# Patient Record
Sex: Male | Born: 1965 | Race: White | Hispanic: No | Marital: Single | Smoking: Current every day smoker
Health system: Southern US, Community
[De-identification: ages and names within clinical notes are randomized; demographics above are authoritative.]

## PROBLEM LIST (undated history)

## (undated) DIAGNOSIS — I10 Essential (primary) hypertension: Secondary | ICD-10-CM

## (undated) DIAGNOSIS — F329 Major depressive disorder, single episode, unspecified: Secondary | ICD-10-CM

## (undated) DIAGNOSIS — F32A Depression, unspecified: Secondary | ICD-10-CM

## (undated) HISTORY — PX: OTHER SURGICAL HISTORY: SHX169

---

## 1998-09-24 ENCOUNTER — Emergency Department (HOSPITAL_COMMUNITY): Admission: EM | Admit: 1998-09-24 | Discharge: 1998-09-24 | Payer: Self-pay | Admitting: Emergency Medicine

## 2002-10-29 ENCOUNTER — Emergency Department (HOSPITAL_COMMUNITY): Admission: EM | Admit: 2002-10-29 | Discharge: 2002-10-29 | Payer: Self-pay | Admitting: Emergency Medicine

## 2002-10-29 ENCOUNTER — Encounter: Payer: Self-pay | Admitting: Emergency Medicine

## 2003-04-02 ENCOUNTER — Emergency Department (HOSPITAL_COMMUNITY): Admission: EM | Admit: 2003-04-02 | Discharge: 2003-04-02 | Payer: Self-pay | Admitting: Emergency Medicine

## 2004-02-08 ENCOUNTER — Emergency Department (HOSPITAL_COMMUNITY): Admission: EM | Admit: 2004-02-08 | Discharge: 2004-02-08 | Payer: Self-pay | Admitting: Emergency Medicine

## 2004-05-31 ENCOUNTER — Emergency Department (HOSPITAL_COMMUNITY): Admission: EM | Admit: 2004-05-31 | Discharge: 2004-05-31 | Payer: Self-pay | Admitting: Emergency Medicine

## 2005-01-09 ENCOUNTER — Emergency Department (HOSPITAL_COMMUNITY): Admission: EM | Admit: 2005-01-09 | Discharge: 2005-01-09 | Payer: Self-pay | Admitting: Emergency Medicine

## 2005-07-28 ENCOUNTER — Emergency Department (HOSPITAL_COMMUNITY): Admission: EM | Admit: 2005-07-28 | Discharge: 2005-07-28 | Payer: Self-pay | Admitting: Emergency Medicine

## 2007-02-06 ENCOUNTER — Emergency Department (HOSPITAL_COMMUNITY): Admission: EM | Admit: 2007-02-06 | Discharge: 2007-02-06 | Payer: Self-pay | Admitting: Emergency Medicine

## 2010-12-11 ENCOUNTER — Inpatient Hospital Stay (HOSPITAL_COMMUNITY)
Admission: RE | Admit: 2010-12-11 | Discharge: 2010-12-15 | DRG: 881 | Disposition: A | Discharge status: Home / Self Care | Payer: Self-pay | Attending: Psychiatry | Admitting: Psychiatry

## 2010-12-11 DIAGNOSIS — F1020 Alcohol dependence, uncomplicated: Secondary | ICD-10-CM

## 2010-12-11 DIAGNOSIS — F329 Major depressive disorder, single episode, unspecified: Principal | ICD-10-CM

## 2010-12-11 DIAGNOSIS — E785 Hyperlipidemia, unspecified: Secondary | ICD-10-CM

## 2010-12-11 DIAGNOSIS — Z818 Family history of other mental and behavioral disorders: Secondary | ICD-10-CM

## 2010-12-11 DIAGNOSIS — F1320 Sedative, hypnotic or anxiolytic dependence, uncomplicated: Secondary | ICD-10-CM

## 2010-12-11 DIAGNOSIS — R51 Headache: Secondary | ICD-10-CM

## 2010-12-11 DIAGNOSIS — M549 Dorsalgia, unspecified: Secondary | ICD-10-CM

## 2010-12-11 DIAGNOSIS — G8929 Other chronic pain: Secondary | ICD-10-CM

## 2010-12-11 DIAGNOSIS — F3289 Other specified depressive episodes: Principal | ICD-10-CM

## 2010-12-11 DIAGNOSIS — I10 Essential (primary) hypertension: Secondary | ICD-10-CM

## 2010-12-12 DIAGNOSIS — F1920 Other psychoactive substance dependence, uncomplicated: Secondary | ICD-10-CM

## 2010-12-12 DIAGNOSIS — F329 Major depressive disorder, single episode, unspecified: Secondary | ICD-10-CM

## 2010-12-12 LAB — CBC
HCT: 46.6 % (ref 39.0–52.0)
Hemoglobin: 15.8 g/dL (ref 13.0–17.0)
RBC: 4.87 MIL/uL (ref 4.22–5.81)
RDW: 15.9 % — ABNORMAL HIGH (ref 11.5–15.5)
WBC: 7.1 10*3/uL (ref 4.0–10.5)

## 2010-12-12 LAB — COMPREHENSIVE METABOLIC PANEL
Albumin: 3.6 g/dL (ref 3.5–5.2)
Alkaline Phosphatase: 73 U/L (ref 39–117)
BUN: 15 mg/dL (ref 6–23)
Chloride: 100 mEq/L (ref 96–112)
Glucose, Bld: 113 mg/dL — ABNORMAL HIGH (ref 70–99)
Potassium: 3.6 mEq/L (ref 3.5–5.1)
Total Bilirubin: 0.5 mg/dL (ref 0.3–1.2)
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
## 2010-12-18 NOTE — Discharge Summary (Signed)
NAME:  Ricardo Collier, Ricardo Collier NO.:  [id number]  MEDICAL RECORD NO.:  [id number]  LOCATION:  0300                          FACILITY:  [facility]  PHYSICIAN:  Ricardo Aloe, MD       DATE OF BIRTH:  06-Jul-1965  DATE OF ADMISSION:  12/11/2010 DATE OF DISCHARGE:  12/15/2010                              DISCHARGE SUMMARY   IDENTIFYING INFORMATION:  This is a 45 year old single male.  This is a voluntary admission.  HISTORY OF PRESENT ILLNESS:  [facility] admission for Ricardo Collier who presented by way of our emergency room where he requested help getting rid of voices that were telling him to drink alcohol and steal things.  He reported that he had just been released from prison after a 4-day stay for stealing a television from the assisted living facility where his mother lives.  He stole a television and got 400 dollars for it, for which he used for alcohol and drugs.  He complains of hearing voices but on closer examination, these are his own thoughts and in fact has no auditory hallucinations.  He reported prior to being jailed he had been taking Xanax off the street for about 3-4 months and was drinking about 18 beers a day.  He reports he cannot settle down, feels restless and anxious.  Denies active suicidal thoughts and wanting help.  MEDICAL EVALUATION AND DIAGNOSTIC STUDIES:  He was medically evaluated in the emergency room, at one point had been told he had high blood pressure and was prescribed HCTZ but is not taking it.  Has no regular primary care provider. Chronic medical problems include occasional headaches and some chronic back pain.  PAST MEDICAL HISTORY:  Significant for no previous back surgery.  No history of seizures.  He did receive some type of medications while in jail and does not know what they were.  PHYSICAL EXAMINATION:  Reveals a normally developed, mildly overweight Caucasian male in no physical distress. VITAL SIGNS:  Pulse 71, blood pressure 134/90.   He is afebrile, weighs 99 kg, 6 feet 1-1/2 inches tall and no abnormal movements. MOTOR:  Is smooth.  No acute signs of withdrawal.  Liver enzymes are normal and normal chemistries, electrolytes and CBC.  COURSE OF HOSPITALIZATION:  He was admitted to our dual diagnosis program and was initially evaluated by Dr. Lawana Collier.  He was transferred to the care of Dr. Orson Collier on July 30, 20212. Participation in unit activities and group therapy was satisfactory. After discussion of risks and benefits he agreed to a trial of Celexa 20 mg daily for his depressed mood and Vistaril 50 mg b.i.d. for anxiety. He was given a provisional diagnosis of depressive disorder NOS and polysubstance abuse versus dependence.  He did not require detox while on our unit.  Did complain of just not feeling right unless he was taking Xanax, had been taking it for quite awhile.  Continued to complain of some anxiety and on December 13, 2010 we added trazodone 100 mg p.o. at bedtime to assist him with sleep and gabapentin 300 mg t.i.d. for anxiety.  We continued to work with the medications and his gabapentin was eventually  increased to 300 mg t.i.d. and 600 mg at bedtime.  He participated well in a.m. group on November 26, 2010 and admitted to alcohol and benzodiazepine abuse and dependence. He worked with our Sports coach and secured an appointment to follow up with [facility] for an assessment for residential treatment.  By July 31,2012 he was doing well.  No dangerous thoughts, tolerating the gabapentin well, had plans to go live with friends and until he goes to [facility] next week.  DISCHARGE/PLAN:  Follow up with [facility] on December 17, 2010 at 8:00 a.m. for follow up with medications and possible counseling and report to [facility] on December 21, 2010 at 8:00 a.m. for evaluation.  DISCHARGE DIAGNOSES:  Axis I:  Depressive disorder NOS, alcohol dependence, benzodiazepine  dependence. Axis II:  Deferred. Axis III:  Hypertension, hyperlipidemia. Axis IV:  Moderate legal issues and psychosocial issues. Axis V:  Current 43.  CONDITION ON DISCHARGE:  Stable.  DISCHARGE MEDICATIONS: 1. Citalopram 40 mg daily for depression and anxiety. 2. Gabapentin 300 mg t.i.d. and 600 mg at bedtime for anxiety. 3. Hydroxyzine 50 mg cap take 2 capsules at bedtime for insomnia and     up to 50 mg b.i.d. p.r.n. for anxiety. 4. Ibuprofen 800 mg t.i.d. p.r.n. for back pain. 5. He is instructed to discontinue lisinopril, Flexeril and     alprazolam.     Ricardo Collier. Ricardo Collier, N.P.   ______________________________ Ricardo Aloe, MD    MAS/MEDQ  D:  12/15/2010  T:  12/15/2010  Job:  [id number]  Electronically Signed by Ricardo Collier N.P. on 12/15/2010 06:57:16 PM Electronically Signed by Ricardo Collier  on 12/18/2010 10:40:12 AM

## 2012-10-20 ENCOUNTER — Emergency Department (HOSPITAL_BASED_OUTPATIENT_CLINIC_OR_DEPARTMENT_OTHER)
Admission: EM | Admit: 2012-10-20 | Discharge: 2012-10-20 | Disposition: A | Discharge status: Home / Self Care | Payer: BC Managed Care – PPO | Attending: Emergency Medicine | Admitting: Emergency Medicine

## 2012-10-20 ENCOUNTER — Emergency Department (HOSPITAL_BASED_OUTPATIENT_CLINIC_OR_DEPARTMENT_OTHER): Payer: BC Managed Care – PPO

## 2012-10-20 ENCOUNTER — Encounter (HOSPITAL_BASED_OUTPATIENT_CLINIC_OR_DEPARTMENT_OTHER): Payer: Self-pay | Admitting: *Deleted

## 2012-10-20 DIAGNOSIS — T148XXA Other injury of unspecified body region, initial encounter: Secondary | ICD-10-CM

## 2012-10-20 DIAGNOSIS — S90129A Contusion of unspecified lesser toe(s) without damage to nail, initial encounter: Secondary | ICD-10-CM | POA: Insufficient documentation

## 2012-10-20 DIAGNOSIS — F17200 Nicotine dependence, unspecified, uncomplicated: Secondary | ICD-10-CM | POA: Insufficient documentation

## 2012-10-20 DIAGNOSIS — S99929A Unspecified injury of unspecified foot, initial encounter: Secondary | ICD-10-CM | POA: Insufficient documentation

## 2012-10-20 DIAGNOSIS — IMO0002 Reserved for concepts with insufficient information to code with codable children: Secondary | ICD-10-CM

## 2012-10-20 DIAGNOSIS — Y9289 Other specified places as the place of occurrence of the external cause: Secondary | ICD-10-CM | POA: Insufficient documentation

## 2012-10-20 DIAGNOSIS — W208XXA Other cause of strike by thrown, projected or falling object, initial encounter: Secondary | ICD-10-CM | POA: Insufficient documentation

## 2012-10-20 DIAGNOSIS — S8990XA Unspecified injury of unspecified lower leg, initial encounter: Secondary | ICD-10-CM | POA: Insufficient documentation

## 2012-10-20 DIAGNOSIS — S91109A Unspecified open wound of unspecified toe(s) without damage to nail, initial encounter: Secondary | ICD-10-CM | POA: Insufficient documentation

## 2012-10-20 DIAGNOSIS — Y9389 Activity, other specified: Secondary | ICD-10-CM | POA: Insufficient documentation

## 2012-10-20 MED ORDER — SULFAMETHOXAZOLE-TRIMETHOPRIM 800-160 MG PO TABS: 1.0000 | ORAL_TABLET | Freq: Two times a day (BID) | ORAL | Status: DC

## 2012-10-20 MED ORDER — HYDROCODONE-ACETAMINOPHEN 5-325 MG PO TABS: 2.0000 | ORAL_TABLET | ORAL | Status: DC | PRN

## 2012-10-20 NOTE — ED Provider Notes (Signed)
History     CSN: [id number]  Arrival date & time 10/20/12  2000   First MD Initiated Contact with Patient 10/20/12 2030      Chief Complaint  Patient presents with  . Foot Injury    (Consider location/radiation/quality/duration/timing/severity/associated sxs/prior treatment) HPI Comments: Comes to the ER for evaluation of foot injury. Patient reports that a very heavy piece of wood fell out of a truck and landed on his left foot yesterday. Patient has been having severe pain ever since. Pain worsens if he touches it or tries to walk.  Patient is a 47 y.o. male presenting with foot injury.  Foot Injury   History reviewed. No pertinent past medical history.  History reviewed. No pertinent past surgical history.  No family history on file.  History  Substance Use Topics  . Smoking status: Current Every Day Smoker -- 0.50 packs/day    Types: Cigarettes  . Smokeless tobacco: Not on file  . Alcohol Use: Yes      Review of Systems  Skin: Positive for wound.    Allergies  Review of patient's allergies indicates no known allergies.  Home Medications  No current outpatient prescriptions on file.  BP 127/91  Pulse 106  Temp(Src) 98.1 F (36.7 C) (Oral)  Resp 18  Ht 6\' 2"  (1.88 m)  Wt 220 lb (99.791 kg)  BMI 28.23 kg/m2  SpO2 96%  Physical Exam  Musculoskeletal:       Left foot: He exhibits tenderness, deformity and laceration.       Feet:  Skin:       ED Course  Procedures (including critical care time)  Labs Reviewed - No data to display Dg Foot Complete Left  10/20/2012   *RADIOLOGY REPORT*  Clinical Data: Foot pain/injury, bruising  LEFT FOOT - COMPLETE 3+ VIEW  Comparison: None.  Findings: No fracture or dislocation is seen.  The joint spaces are preserved.  The visualized soft tissues are unremarkable.  IMPRESSION: No fracture or dislocation is seen.   Original Report Authenticated By: Charline Bills, M.D.     Diagnosis: Contusion and laceration  left great toe and foot    MDM  Patient presents to the ER for evaluation of a crush injury to his left foot. Patient has bruising and swelling of the great toe and the dorsal aspect of the foot distally. There is no obvious deformity. He does have a laceration on the left side of the toe. Toenail is partially avulsed. X-ray shows no evidence of fracture. Wound is greater than 24 hours old, could not be sutured. Local wound care provided. Patient to rest, elevate and ice the area. Analgesia provided.        Gilda Crease, MD 10/20/12 2102

## 2012-10-20 NOTE — ED Notes (Signed)
Left foot injury yesterday when he dropped a piece of a log fall from the truck tailgate landing on his foot. Crush injury. Blood under his great toenail.

## 2014-05-23 ENCOUNTER — Emergency Department (HOSPITAL_BASED_OUTPATIENT_CLINIC_OR_DEPARTMENT_OTHER)
Admission: EM | Admit: 2014-05-23 | Discharge: 2014-05-23 | Disposition: A | Discharge status: Home / Self Care | Payer: Self-pay | Attending: Emergency Medicine | Admitting: Emergency Medicine

## 2014-05-23 ENCOUNTER — Encounter (HOSPITAL_BASED_OUTPATIENT_CLINIC_OR_DEPARTMENT_OTHER): Payer: Self-pay | Admitting: *Deleted

## 2014-05-23 ENCOUNTER — Emergency Department (HOSPITAL_BASED_OUTPATIENT_CLINIC_OR_DEPARTMENT_OTHER): Payer: Self-pay

## 2014-05-23 DIAGNOSIS — S51811A Laceration without foreign body of right forearm, initial encounter: Secondary | ICD-10-CM | POA: Insufficient documentation

## 2014-05-23 DIAGNOSIS — S61012A Laceration without foreign body of left thumb without damage to nail, initial encounter: Secondary | ICD-10-CM | POA: Insufficient documentation

## 2014-05-23 DIAGNOSIS — Z23 Encounter for immunization: Secondary | ICD-10-CM | POA: Insufficient documentation

## 2014-05-23 DIAGNOSIS — Y998 Other external cause status: Secondary | ICD-10-CM | POA: Insufficient documentation

## 2014-05-23 DIAGNOSIS — S61011A Laceration without foreign body of right thumb without damage to nail, initial encounter: Secondary | ICD-10-CM | POA: Insufficient documentation

## 2014-05-23 DIAGNOSIS — R52 Pain, unspecified: Secondary | ICD-10-CM

## 2014-05-23 DIAGNOSIS — Z720 Tobacco use: Secondary | ICD-10-CM | POA: Insufficient documentation

## 2014-05-23 DIAGNOSIS — Y9289 Other specified places as the place of occurrence of the external cause: Secondary | ICD-10-CM | POA: Insufficient documentation

## 2014-05-23 DIAGNOSIS — I10 Essential (primary) hypertension: Secondary | ICD-10-CM | POA: Insufficient documentation

## 2014-05-23 DIAGNOSIS — W540XXA Bitten by dog, initial encounter: Secondary | ICD-10-CM | POA: Insufficient documentation

## 2014-05-23 DIAGNOSIS — S60511A Abrasion of right hand, initial encounter: Secondary | ICD-10-CM | POA: Insufficient documentation

## 2014-05-23 DIAGNOSIS — Z79899 Other long term (current) drug therapy: Secondary | ICD-10-CM | POA: Insufficient documentation

## 2014-05-23 DIAGNOSIS — Y9389 Activity, other specified: Secondary | ICD-10-CM | POA: Insufficient documentation

## 2014-05-23 HISTORY — DX: Essential (primary) hypertension: I10

## 2014-05-23 MED ORDER — METHOCARBAMOL 500 MG PO TABS: 500.0000 mg | ORAL_TABLET | Freq: Two times a day (BID) | ORAL | Status: DC

## 2014-05-23 MED ORDER — AMOXICILLIN-POT CLAVULANATE 875-125 MG PO TABS
1.0000 | ORAL_TABLET | Freq: Once | ORAL | Status: AC
  Administered 2014-05-23: 1 via ORAL
  Filled 2014-05-23: qty 1

## 2014-05-23 MED ORDER — METHOCARBAMOL 500 MG PO TABS
1000.0000 mg | ORAL_TABLET | Freq: Once | ORAL | Status: AC
  Administered 2014-05-23: 1000 mg via ORAL
  Filled 2014-05-23: qty 2

## 2014-05-23 MED ORDER — OXYCODONE-ACETAMINOPHEN 5-325 MG PO TABS
1.0000 | ORAL_TABLET | Freq: Once | ORAL | Status: AC
  Administered 2014-05-23: 1 via ORAL
  Filled 2014-05-23: qty 1

## 2014-05-23 MED ORDER — NAPROXEN 375 MG PO TABS: 375.0000 mg | ORAL_TABLET | Freq: Two times a day (BID) | ORAL | Status: DC

## 2014-05-23 MED ORDER — TETANUS-DIPHTH-ACELL PERTUSSIS 5-2.5-18.5 LF-MCG/0.5 IM SUSP
0.5000 mL | Freq: Once | INTRAMUSCULAR | Status: AC
  Administered 2014-05-23: 0.5 mL via INTRAMUSCULAR
  Filled 2014-05-23: qty 0.5

## 2014-05-23 MED ORDER — AMOXICILLIN-POT CLAVULANATE 875-125 MG PO TABS: 1.0000 | ORAL_TABLET | Freq: Two times a day (BID) | ORAL | Status: DC

## 2014-05-23 MED ORDER — TRAMADOL HCL 50 MG PO TABS: 50.0000 mg | ORAL_TABLET | Freq: Four times a day (QID) | ORAL | Status: DC | PRN

## 2014-05-23 NOTE — ED Notes (Signed)
Pt c/o multiple dog bites to hands and arms. Also c/o lower back pain
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
## 2014-07-31 ENCOUNTER — Encounter (HOSPITAL_BASED_OUTPATIENT_CLINIC_OR_DEPARTMENT_OTHER): Payer: Self-pay

## 2014-07-31 ENCOUNTER — Emergency Department (HOSPITAL_BASED_OUTPATIENT_CLINIC_OR_DEPARTMENT_OTHER): Payer: Self-pay

## 2014-07-31 ENCOUNTER — Emergency Department (HOSPITAL_BASED_OUTPATIENT_CLINIC_OR_DEPARTMENT_OTHER)
Admission: EM | Admit: 2014-07-31 | Discharge: 2014-08-01 | Disposition: A | Discharge status: Home / Self Care | Payer: Self-pay | Attending: Emergency Medicine | Admitting: Emergency Medicine

## 2014-07-31 DIAGNOSIS — I10 Essential (primary) hypertension: Secondary | ICD-10-CM | POA: Insufficient documentation

## 2014-07-31 DIAGNOSIS — R0602 Shortness of breath: Secondary | ICD-10-CM | POA: Insufficient documentation

## 2014-07-31 DIAGNOSIS — R51 Headache: Secondary | ICD-10-CM | POA: Insufficient documentation

## 2014-07-31 DIAGNOSIS — R197 Diarrhea, unspecified: Secondary | ICD-10-CM | POA: Insufficient documentation

## 2014-07-31 DIAGNOSIS — R5381 Other malaise: Secondary | ICD-10-CM | POA: Insufficient documentation

## 2014-07-31 DIAGNOSIS — R000 Tachycardia, unspecified: Secondary | ICD-10-CM | POA: Insufficient documentation

## 2014-07-31 DIAGNOSIS — R1110 Vomiting, unspecified: Secondary | ICD-10-CM | POA: Insufficient documentation

## 2014-07-31 DIAGNOSIS — H538 Other visual disturbances: Secondary | ICD-10-CM | POA: Insufficient documentation

## 2014-07-31 DIAGNOSIS — R52 Pain, unspecified: Secondary | ICD-10-CM

## 2014-07-31 DIAGNOSIS — Z79899 Other long term (current) drug therapy: Secondary | ICD-10-CM | POA: Insufficient documentation

## 2014-07-31 DIAGNOSIS — R05 Cough: Secondary | ICD-10-CM | POA: Insufficient documentation

## 2014-07-31 DIAGNOSIS — R61 Generalized hyperhidrosis: Secondary | ICD-10-CM | POA: Insufficient documentation

## 2014-07-31 DIAGNOSIS — R21 Rash and other nonspecific skin eruption: Secondary | ICD-10-CM | POA: Insufficient documentation

## 2014-07-31 DIAGNOSIS — R509 Fever, unspecified: Secondary | ICD-10-CM | POA: Insufficient documentation

## 2014-07-31 DIAGNOSIS — R0789 Other chest pain: Secondary | ICD-10-CM | POA: Insufficient documentation

## 2014-07-31 DIAGNOSIS — E860 Dehydration: Secondary | ICD-10-CM | POA: Insufficient documentation

## 2014-07-31 DIAGNOSIS — R42 Dizziness and giddiness: Secondary | ICD-10-CM | POA: Insufficient documentation

## 2014-07-31 DIAGNOSIS — Z720 Tobacco use: Secondary | ICD-10-CM | POA: Insufficient documentation

## 2014-07-31 LAB — CBC WITH DIFFERENTIAL/PLATELET
BASOS ABS: 0 10*3/uL (ref 0.0–0.1)
BASOS PCT: 0 % (ref 0–1)
EOS ABS: 0.1 10*3/uL (ref 0.0–0.7)
Eosinophils Relative: 1 % (ref 0–5)
HCT: 49.7 % (ref 39.0–52.0)
Hemoglobin: 19 g/dL — ABNORMAL HIGH (ref 13.0–17.0)
Lymphocytes Relative: 45 % (ref 12–46)
Lymphs Abs: 2.6 10*3/uL (ref 0.7–4.0)
MCH: 41.5 pg — ABNORMAL HIGH (ref 26.0–34.0)
MCHC: 38.2 g/dL — AB (ref 30.0–36.0)
MCV: 108.5 fL — ABNORMAL HIGH (ref 78.0–100.0)
Monocytes Absolute: 0.8 10*3/uL (ref 0.1–1.0)
Monocytes Relative: 14 % — ABNORMAL HIGH (ref 3–12)
NEUTROS PCT: 40 % — AB (ref 43–77)
Neutro Abs: 2.3 10*3/uL (ref 1.7–7.7)
PLATELETS: 216 10*3/uL (ref 150–400)
RBC: 4.58 MIL/uL (ref 4.22–5.81)
RDW: 12 % (ref 11.5–15.5)
WBC: 5.8 10*3/uL (ref 4.0–10.5)

## 2014-07-31 LAB — COMPREHENSIVE METABOLIC PANEL
ALT: 110 U/L — ABNORMAL HIGH (ref 0–53)
ANION GAP: 10 (ref 5–15)
AST: 136 U/L — ABNORMAL HIGH (ref 0–37)
Albumin: 4.5 g/dL (ref 3.5–5.2)
Alkaline Phosphatase: 79 U/L (ref 39–117)
BILIRUBIN TOTAL: 1 mg/dL (ref 0.3–1.2)
BUN: 20 mg/dL (ref 6–23)
CO2: 28 mmol/L (ref 19–32)
Calcium: 9.5 mg/dL (ref 8.4–10.5)
Chloride: 96 mmol/L (ref 96–112)
Creatinine, Ser: 1.39 mg/dL — ABNORMAL HIGH (ref 0.50–1.35)
GFR calc Af Amer: 67 mL/min — ABNORMAL LOW (ref >90)
GFR, EST NON AFRICAN AMERICAN: 58 mL/min — AB (ref >90)
Glucose, Bld: 129 mg/dL — ABNORMAL HIGH (ref 70–99)
Potassium: 3.4 mmol/L — ABNORMAL LOW (ref 3.5–5.1)
Sodium: 134 mmol/L — ABNORMAL LOW (ref 135–145)
TOTAL PROTEIN: 8.6 g/dL — AB (ref 6.0–8.3)

## 2014-07-31 LAB — TROPONIN I

## 2014-07-31 LAB — LIPASE, BLOOD: Lipase: 57 U/L (ref 11–59)

## 2014-07-31 MED ORDER — SODIUM CHLORIDE 0.9 % IV BOLUS (SEPSIS)
2000.0000 mL | Freq: Once | INTRAVENOUS | Status: AC
  Administered 2014-07-31: 2000 mL via INTRAVENOUS

## 2014-07-31 NOTE — ED Notes (Signed)
Pt states last Thursday  Indigestion,  Friday vomited x 10, ran fever friday and sat sweaty fever up to 103, w increased bp,  Today c/o blurred vision cough, hoarse clammy and sweaty, and upper back pain

## 2014-07-31 NOTE — ED Notes (Addendum)
Pt reports he developed a fever last Thursday - c/o feeling dizziness, coughing, blurred vision, pain to back (between shoulder blades), generalized weakness, vomiting, sweating, feeling clammy. Pt reports he is out of his lisinopril. Denies chest pain. Smile symmetrical. Pt ambulates with steady gait. Grips equal. Pt alert, oriented, clear speech pattern.

## 2014-07-31 NOTE — ED Provider Notes (Signed)
CSN: [id number]     Arrival date & time 07/31/14  2003 History  This chart was scribed for Evelina Bucy, MD by Steva Colder, ED Scribe. The patient was seen in room MH01/MH01 at 9:57 PM.     Chief Complaint  Patient presents with  . Weakness      Patient is a 49 y.o. male presenting with weakness. The history is provided by the patient. No language interpreter was used.  Weakness This is a new problem. The current episode started more than 2 days ago. The problem occurs constantly. The problem has not changed since onset.Associated symptoms include headaches and shortness of breath. Pertinent negatives include no abdominal pain. Nothing aggravates the symptoms. Nothing relieves the symptoms. He has tried nothing for the symptoms.    HPI Comments: Ricardo Collier is a 49 y.o. male with a medical hx of HTN who presents to the Emergency Department complaining of weakness onset 1 week. Pt notes that his symptoms began on Thursday with indigestion. Friday the pt vomited 10 times and then a fever of 103. Pt bp went to 193/168 and he was out of his lisinopril. Pt notes that these symptoms continued until Saturday. He states that he is having associated symptoms of indigestion, vomiting, diarrhea, diaphoresis, HA, blurred vision, cough, upper back pain, chest tightness, SOB, lightheaded, generalized red splotchy spots, red spots on bilateral knees. Pt notes that his knees are cold. Pt has not worked since the symptoms began. Pt feels like he is being kicked in the back between his shoulder blades. He states that he has tried Prilosec with no relief for his symptoms. He denies rash, nausea, and any other symptoms. Pt voices concern for if this is a thyroid issue because his mother has thyroid issues too.  Pt does not have a PCP.    Past Medical History  Diagnosis Date  . Hypertension    History reviewed. No pertinent past surgical history. History reviewed. No pertinent family history. History   Substance Use Topics  . Smoking status: Current Every Day Smoker -- 0.50 packs/day    Types: Cigarettes  . Smokeless tobacco: Not on file  . Alcohol Use: Yes     Comment: daily    Review of Systems  Constitutional: Positive for fever (5 days ago) and diaphoresis.  Eyes: Positive for visual disturbance.  Respiratory: Positive for cough, chest tightness and shortness of breath.   Gastrointestinal: Positive for vomiting (10 times, 5 days ago) and diarrhea. Negative for nausea and abdominal pain.  Genitourinary: Negative for dysuria.  Musculoskeletal: Positive for back pain.  Skin: Positive for color change (redness noted to bilateral knees) and rash.  Neurological: Positive for weakness, light-headedness and headaches.  All other systems reviewed and are negative.     Allergies  Review of patient's allergies indicates no known allergies.  Home Medications   Prior to Admission medications   Medication Sig Start Date End Date Taking? Authorizing Provider  amoxicillin-clavulanate (AUGMENTIN) 875-125 MG per tablet Take 1 tablet by mouth 2 (two) times daily. One po bid x 7 days 05/23/14   April Palumbo, MD  lisinopril (PRINIVIL,ZESTRIL) 10 MG tablet Take 10 mg by mouth daily.    Historical Provider, MD  methocarbamol (ROBAXIN) 500 MG tablet Take 1 tablet (500 mg total) by mouth 2 (two) times daily. 05/23/14   April Palumbo, MD  naproxen (NAPROSYN) 375 MG tablet Take 1 tablet (375 mg total) by mouth 2 (two) times daily. 05/23/14   Veatrice Kells, MD  traMADol (ULTRAM) 50 MG tablet Take 1 tablet (50 mg total) by mouth every 6 (six) hours as needed. 05/23/14   April Palumbo, MD   BP 125/94 mmHg  Pulse 132  Temp(Src) 97.9 F (36.6 C) (Oral)  Resp 19  Ht 6\' 2"  (1.88 m)  Wt 218 lb (98.884 kg)  BMI 27.98 kg/m2  SpO2 100%  Physical Exam  Constitutional: He is oriented to person, place, and time. He appears well-developed and well-nourished. No distress.  HENT:  Head: Normocephalic and  atraumatic.  Mouth/Throat: No oropharyngeal exudate.  Eyes: Pupils are equal, round, and reactive to light.  Neck: Normal range of motion. Neck supple.  Cardiovascular: Regular rhythm and normal heart sounds.  Tachycardia present.  Exam reveals no gallop and no friction rub.   No murmur heard. Pulmonary/Chest: Effort normal and breath sounds normal. No respiratory distress. He has no wheezes. He has no rales.  Abdominal: Soft. Bowel sounds are normal. He exhibits no distension and no mass. There is no tenderness. There is no rebound and no guarding.  Musculoskeletal: Normal range of motion. He exhibits no edema or tenderness.       Back:  Neurological: He is alert and oriented to person, place, and time.  Skin: Skin is warm and dry.  Psychiatric: He has a normal mood and affect.  Nursing note and vitals reviewed.   ED Course  Procedures (including critical care time) DIAGNOSTIC STUDIES: Oxygen Saturation is 100% on RA, normal by my interpretation.    COORDINATION OF CARE: 10:10 PM-Discussed treatment plan which includes IV fluids, CT head, thyroid levels with pt at bedside and pt agreed to plan.   Labs Review Labs Reviewed  CBC WITH DIFFERENTIAL/PLATELET - Abnormal; Notable for the following:    Hemoglobin 19.0 (*)    MCV 108.5 (*)    MCH 41.5 (*)    MCHC 38.2 (*)    Neutrophils Relative % 40 (*)    Monocytes Relative 14 (*)    All other components within normal limits  COMPREHENSIVE METABOLIC PANEL - Abnormal; Notable for the following:    Sodium 134 (*)    Potassium 3.4 (*)    Glucose, Bld 129 (*)    Creatinine, Ser 1.39 (*)    Total Protein 8.6 (*)    AST 136 (*)    ALT 110 (*)    GFR calc non Af Amer 58 (*)    GFR calc Af Amer 67 (*)    All other components within normal limits  LIPASE, BLOOD  TROPONIN I  URINALYSIS, ROUTINE W REFLEX MICROSCOPIC    Imaging Review Dg Chest 2 View  07/31/2014   CLINICAL DATA:  Cough congestion and fever  EXAM: CHEST  2 VIEW   COMPARISON:  01/09/2005  FINDINGS: There is old healed right clavicular fracture deformity. The lungs are clear. The pulmonary vasculature is normal. There are no pleural effusions. Hilar, mediastinal and cardiac contours are unremarkable and unchanged.  IMPRESSION: No active cardiopulmonary disease.   Electronically Signed   By: Andreas Newport M.D.   On: 07/31/2014 21:43     EKG Interpretation   Date/Time:  Wednesday July 31 2014 20:25:19 EDT Ventricular Rate:  119 PR Interval:  170 QRS Duration: 80 QT Interval:  320 QTC Calculation: 450 R Axis:   25 Text Interpretation:  Sinus tachycardia Otherwise normal ECG No prior for  comparison Confirmed by Mingo Amber  MD, [place] ([id number]) on 07/31/2014 8:32:25 PM      MDM   Final  diagnoses:  Blurry vision  Dehydration  Aching  Malaise    49 year old male who has multiple complaints. He states many of these began 5 days ago after having an episode of peri-fevers where he vomited 10 times. He has not had fever or vomiting since then. He has noted some blurry vision, constant sharp left-sided chest pain., Generalized fatigue, occasional dizziness, diaphoresis. He has not been taking his hypertension meds for long time.  Initially he was tachycardic at 132, other vitals are stable. He reported having high blood pressures rechecked at home earlier today but all blood pressures here are normal. His heart rate is in improving after fluids. Exam was fairly benign, no distinctive features noted. Unsure of patient's symptoms. I think he could've had a virus for 5 days ago and is fine and getting over it slowly. Many of his complaints are nonspecific. He has a normal urine. His hemoglobin is elevated which could mean he is concentrated. His creatinine is bumped to 1.4, last noted in the computer was around 0.8. This could mean some dehydration. We'll give fluids.  Does also endorse some back pain. TTP in middle trapezius bilaterally.  With his blurry vision  will check visual acuities. He has a normal neurologic exam otherwise. We'll scan his head. CT head ok. Informed to f/u with eye doctor and with a PCP from the resource guide. Given 1 month of lisinopril until her can find a PCP.  I personally performed the services described in this documentation, which was scribed in my presence. The recorded information has been reviewed and is accurate.     Evelina Bucy, MD 08/01/14 [phone]

## 2014-08-01 LAB — URINALYSIS, ROUTINE W REFLEX MICROSCOPIC
Bilirubin Urine: NEGATIVE
Glucose, UA: NEGATIVE mg/dL
Hgb urine dipstick: NEGATIVE
KETONES UR: NEGATIVE mg/dL
Leukocytes, UA: NEGATIVE
Nitrite: NEGATIVE
Protein, ur: NEGATIVE mg/dL
Specific Gravity, Urine: 1.017 (ref 1.005–1.030)
Urobilinogen, UA: 0.2 mg/dL (ref 0.0–1.0)
pH: 5.5 (ref 5.0–8.0)

## 2014-08-01 LAB — TSH: TSH: 1.879 u[IU]/mL (ref 0.350–4.500)

## 2014-08-01 MED ORDER — LISINOPRIL 10 MG PO TABS: 10.0000 mg | ORAL_TABLET | Freq: Every day | ORAL | Status: DC

## 2014-08-01 MED ORDER — HYDROCODONE-ACETAMINOPHEN 5-325 MG PO TABS: 1.0000 | ORAL_TABLET | Freq: Four times a day (QID) | ORAL | Status: DC | PRN

## 2014-08-01 NOTE — Discharge Instructions (Signed)
Dehydration, Adult Dehydration means your body does not have as much fluid as it needs. Your kidneys, brain, and heart will not work properly without the right amount of fluids and salt.  HOME CARE  Ask your doctor how to replace body fluid losses (rehydrate).  Drink enough fluids to keep your pee (urine) clear or pale yellow.  Drink small amounts of fluids often if you feel sick to your stomach (nauseous) or throw up (vomit).  Eat like you normally do.  Avoid:  Foods or drinks high in sugar.  Bubbly (carbonated) drinks.  Juice.  Very hot or cold fluids.  Drinks with caffeine.  Fatty, greasy foods.  Alcohol.  Tobacco.  Eating too much.  Gelatin desserts.  Wash your hands to avoid spreading germs (bacteria, viruses).  Only take medicine as told by your doctor.  Keep all doctor visits as told. GET HELP RIGHT AWAY IF:   You cannot drink something without throwing up.  You get worse even with treatment.  Your vomit has blood in it or looks greenish.  Your poop (stool) has blood in it or looks black and tarry.  You have not peed in 6 to 8 hours.  You pee a small amount of very dark pee.  You have a fever.  You pass out (faint).  You have belly (abdominal) pain that gets worse or stays in one spot (localizes).  You have a rash, stiff neck, or bad headache.  You get easily annoyed, sleepy, or are hard to wake up.  You feel weak, dizzy, or very thirsty. MAKE SURE YOU:   Understand these instructions.  Will watch your condition.  Will get help right away if you are not doing well or get worse. Document Released: 02/27/2009 Document Revised: 07/26/2011 Document Reviewed: 12/21/2010 [facility] Patient Information 2015 [place], [place]. This information is not intended to replace advice given to you by your health care provider. Make sure you discuss any questions you have with your health care provider.  Blurred Vision You have been seen today complaining  of blurred vision. This means you have a loss of ability to see small details.  CAUSES  Blurred vision can be a symptom of underlying eye problems, such as:  Aging of the eye (presbyopia).  Glaucoma.  Cataracts.  Eye infection.  Eye-related migraine.  Diabetes mellitus.  Fatigue.  Migraine headaches.  High blood pressure.  Breakdown of the back of the eye (macular degeneration).  Problems caused by some medications. The most common cause of blurred vision is the need for eyeglasses or a new prescription. Today in the emergency department, no cause for your blurred vision can be found. SYMPTOMS  Blurred vision is the loss of visual sharpness and detail (acuity). DIAGNOSIS  Should blurred vision continue, you should see your caregiver. If your caregiver is your primary care physician, he or she may choose to refer you to another specialist.  TREATMENT  Do not ignore your blurred vision. Make sure to have it checked out to see if further treatment or referral is necessary. SEEK MEDICAL CARE IF:  You are unable to get into a specialist so we can help you with a referral. SEEK IMMEDIATE MEDICAL CARE IF: You have severe eye pain, severe headache, or sudden loss of vision. MAKE SURE YOU:   Understand these instructions.  Will watch your condition.  Will get help right away if you are not doing well or get worse. Document Released: 05/06/2003 Document Revised: 07/26/2011 Document Reviewed: 12/06/2007 ExitCare Patient  Information 2015 [place], [place]. This information is not intended to replace advice given to you by your health care provider. Make sure you discuss any questions you have with your health care provider.  Fatigue Fatigue is a feeling of tiredness, lack of energy, lack of motivation, or feeling tired all the time. Having enough rest, good nutrition, and reducing stress will normally reduce fatigue. Consult your caregiver if it persists. The nature of your fatigue  will help your caregiver to find out its cause. The treatment is based on the cause.  CAUSES  There are many causes for fatigue. Most of the time, fatigue can be traced to one or more of your habits or routines. Most causes fit into one or more of three general areas. They are: Lifestyle problems  Sleep disturbances.  Overwork.  Physical exertion.  Unhealthy habits.  Poor eating habits or eating disorders.  Alcohol and/or drug use .  Lack of proper nutrition (malnutrition). Psychological problems  Stress and/or anxiety problems.  Depression.  Grief.  Boredom. Medical Problems or Conditions  Anemia.  Pregnancy.  Thyroid gland problems.  Recovery from major surgery.  Continuous pain.  Emphysema or asthma that is not well controlled  Allergic conditions.  Diabetes.  Infections (such as mononucleosis).  Obesity.  Sleep disorders, such as sleep apnea.  Heart failure or other heart-related problems.  Cancer.  Kidney disease.  Liver disease.  Effects of certain medicines such as antihistamines, cough and cold remedies, prescription pain medicines, heart and blood pressure medicines, drugs used for treatment of cancer, and some antidepressants. SYMPTOMS  The symptoms of fatigue include:   Lack of energy.  Lack of drive (motivation).  Drowsiness.  Feeling of indifference to the surroundings. DIAGNOSIS  The details of how you feel help guide your caregiver in finding out what is causing the fatigue. You will be asked about your present and past health condition. It is important to review all medicines that you take, including prescription and non-prescription items. A thorough exam will be done. You will be questioned about your feelings, habits, and normal lifestyle. Your caregiver may suggest blood tests, urine tests, or other tests to look for common medical causes of fatigue.  TREATMENT  Fatigue is treated by correcting the underlying cause. For  example, if you have continuous pain or depression, treating these causes will improve how you feel. Similarly, adjusting the dose of certain medicines will help in reducing fatigue.  HOME CARE INSTRUCTIONS   Try to get the required amount of good sleep every night.  Eat a healthy and nutritious diet, and drink enough water throughout the day.  Practice ways of relaxing (including yoga or meditation).  Exercise regularly.  Make plans to change situations that cause stress. Act on those plans so that stresses decrease over time. Keep your work and personal routine reasonable.  Avoid street drugs and minimize use of alcohol.  Start taking a daily multivitamin after consulting your caregiver. SEEK MEDICAL CARE IF:   You have persistent tiredness, which cannot be accounted for.  You have fever.  You have unintentional weight loss.  You have headaches.  You have disturbed sleep throughout the night.  You are feeling sad.  You have constipation.  You have dry skin.  You have gained weight.  You are taking any new or different medicines that you suspect are causing fatigue.  You are unable to sleep at night.  You develop any unusual swelling of your legs or other parts of your body.  SEEK IMMEDIATE MEDICAL CARE IF:   You are feeling confused.  Your vision is blurred.  You feel faint or pass out.  You develop severe headache.  You develop severe abdominal, pelvic, or back pain.  You develop chest pain, shortness of breath, or an irregular or fast heartbeat.  You are unable to pass a normal amount of urine.  You develop abnormal bleeding such as bleeding from the rectum or you vomit blood.  You have thoughts about harming yourself or committing suicide.  You are worried that you might harm someone else. MAKE SURE YOU:   Understand these instructions.  Will watch your condition.  Will get help right away if you are not doing well or get worse. Document  Released: 02/28/2007 Document Revised: 07/26/2011 Document Reviewed: 09/04/2013 [facility] Patient Information 2015 [place], [place]. This information is not intended to replace advice given to you by your health care provider. Make sure you discuss any questions you have with your health care provider.   Emergency Department Resource Guide 1) Find a Doctor and Pay Out of Pocket Although you won't have to find out who is covered by your insurance plan, it is a good idea to ask around and get recommendations. You will then need to call the office and see if the doctor you have chosen will accept you as a new patient and what types of options they offer for patients who are self-pay. Some doctors offer discounts or will set up payment plans for their patients who do not have insurance, but you will need to ask so you aren't surprised when you get to your appointment.  2) Contact Your Local Health Department Not all health departments have doctors that can see patients for sick visits, but many do, so it is worth a call to see if yours does. If you don't know where your local health department is, you can check in your phone book. The CDC also has a tool to help you locate your state's health department, and many state websites also have listings of all of their local health departments.  3) Find a [facility] If your illness is not likely to be very severe or complicated, you may want to try a walk in clinic. These are popping up all over the country in pharmacies, drugstores, and shopping centers. They're usually staffed by nurse practitioners or physician assistants that have been trained to treat common illnesses and complaints. They're usually fairly quick and inexpensive. However, if you have serious medical issues or chronic medical problems, these are probably not your best option.  No Primary Care Doctor: - Call Health Connect at  [phone] - they can help you locate a primary care doctor that   accepts your insurance, provides certain services, etc. - Physician Referral Service- [phone]  Chronic Pain Problems: Organization         Address  Phone   Notes  [facility]  [phone] Patients need to be referred by their primary care doctor.   Medication Assistance: Organization         Address  Phone   Notes  [facility] Medication [facility] [address] [place], [place] [address] [phone] --Must be a resident of [facility] -- Must have NO insurance coverage whatsoever (no Medicaid/ Medicare, etc.) -- The pt. MUST have a primary care doctor that directs their care regularly and follows them in the community   Med[facility]ist  [phone]   [place]  [address]  [phone]    Agencies that provide inexpensive medical care: Organization         Address  Phone   Notes  [place]  [phone]   [facility]    [phone]   [facility] [place], [place] [address] [phone]   [place] [address] [place] [phone]   Planned Parenthood    [phone]   [facility]    [phone]   [place] and [place] [address], [place] Phone:  [phone], Fax:  [phone] Hours of Operation:  9 am - 6 pm, M-F.  Also accepts Medicaid/Medicare and self-pay.  [facility] for [address], [address], [place] Phone: [phone], Fax: [phone]. Hours of Operation:  8:30 am - 5:30 pm, M-F.  Also accepts Medicaid and self-pay.  [facility] [place] [address] [place] Phone: [phone]   [place], [place], [place] [phone], Ext. [phone] Mondays & Thursdays: 7-9 AM.  First 15 patients are seen on a first come, first serve basis.    [place] Providers:  Organization          Address  Phone   Notes  [facility] [address], [place] [phone] Also accepts self-pay patients.  [facility] [address], [place]  [phone]   [place], [address], [place] [phone]   [facility] [address] [place] [phone]   Lucianne Lei [address], [place]   [phone] Only accepts [place] Access [place] patients after they have their name applied to their card.   Self-Pay (no insurance) in [facility]:  Organization         Address  Phone   Notes  Sickle Cell Patients, [facility] [place] [phone]   [facility] [place] [phone]   [facility] [place]  [place], [place], [place] [phone]   [facility]/Dr. Osei-Bonsu  [address] [place] or [address], [place] [phone] Phone number for both [place] and [place] locations is the same.  Urgent Medical and [facility] [place] [address], [place] [phone]   [facility] [address], [place] or [address] [phone]   [facility] [address] [place] [phone], phone; [phone], fax Sees patients 1st and 3rd Saturday of every month.  Must not qualify for public or private insurance (i.e. Medicaid, Medicare, [place] Health Choice, Veterans' Benefits)  Household income should be no more than 200% of the poverty level The clinic cannot treat you if you are pregnant or think you are pregnant  Sexually transmitted diseases are not treated at the clinic.    Dental Care: Organization         Address  Phone  Notes  [facility] Department of [facility] [place] [phone] Accepts children up to age 48 who are enrolled in [place] or [place]; pregnant women with a Medicaid card; and  children who have applied for Medicaid or [place] Health Choice, but were declined, whose parents can pay a reduced fee at time of service.  [facility] Department of [facility]  [address], [place] [phone] Accepts children up to age 51 who are enrolled in [place] or [place]; pregnant women with a Medicaid card; and children who have applied for Medicaid or [place] Health Choice, but were declined, whose parents can pay a reduced fee at time of service.  [facility] Adult [facility] Access PROGRAM  [place] [phone] Patients are seen by appointment only. Walk-ins are not accepted. [place] will see patients 46 years of age and older. Monday - Tuesday (8am-5pm) Most Wednesdays (8:30-5pm) $30 per visit, cash only  [facility] Adult Dental Access PROGRAM  [address], [facility] [phone] Patients are seen by appointment only. Walk-ins are not accepted. [place] will see patients 71 years of age and older. One Wednesday Evening (Monthly: Volunteer Based).  $30 per visit, cash only  [place]  [phone] for adults; Children under age 13, call Graduate Pediatric Dentistry at [phone]. Children aged 66-14, please call [phone] to request a pediatric application.  Dental services are provided in all areas of dental care including fillings, crowns and bridges, complete and partial dentures, implants, gum treatment, root canals, and extractions. Preventive care is also provided. Treatment is provided to both adults and children. Patients are selected via a lottery and there is often a waiting list.   [facility] [address], [place]  [phone] [email or web address]   [facility] [address] [place], [place]  [phone], Ext. [phone] Second and Fourth Thursday of each month, opens at 6:30 AM; Clinic ends at 9 AM.  Patients are seen on a first-come first-served basis, and a limited number are seen during each clinic.   [facility]  [address] Hillard Danker [place], [place] [phone]   Eligibility Requirements You must have lived in [place], [place], or [place] counties for at least the last three months.   You cannot be eligible for state or federal sponsored [employer], including [employer], [place], or [employer].   You generally cannot be eligible for healthcare insurance through your employer.    How to apply: Eligibility screenings are held every Tuesday and Wednesday afternoon from 1:00 pm until 4:00 pm. You do not need an appointment for the interview!  [facility] [address] [place], [place]   [place]  [place] Department  [place]  [phone]    Behavioral Health Resources in the Community: Intensive Outpatient Programs Organization         Address  Phone  Notes  [place]. [address], [place], [place] [phone]   [facility] Outpatient [address] [place], [place]   ADS: Alcohol & Drug Svcs [address] [place], [place]   [place] [address]  [place], [place] or [phone]   Substance Abuse Resources Organization         Address  Phone  Notes  Alcohol and Drug Services  [phone]   [facility]  [phone]   [facility] [place]   [facility]  [phone]   Residential &  Outpatient Substance Abuse Program  [phone]   Psychological Services Organization         Address  Phone  Notes  [facility] Behavioral Health  [place]  [phone]    [place] [address] [place] or [phone]    Mobile Crisis Teams Organization         Address  Phone  Notes  [facility], Mobile Crisis Care Unit  [phone]   [facility]  [address] [place], [place]   [place] [address], [place] [phone]    Self-Help/Support Groups Organization         Address  Phone             Notes  [place]. of [place] - variety of support groups  [place] Call for more information  Narcotics Anonymous (NA), [facility] [address], [employer] [place]  2 meetings at this location   Special educational needs teacher         Address  Phone  Notes  ASAP Residential Treatment [place],    [place]  [phone]   [facility]  [address] [place] 694503, [place], [place]   [place], [place] [phone] Admissions: 8am-3pm M-F  Incentives Substance [place] [address]    [place], [place] [phone]   [facility] [address] [place], [place], [place]   [facility] [address]  [place], [place]   [facility] - Intensive Outpatient [address], [place], [place]   [facility] ([place] [address].,  [place], [place] [phone] or [phone]   [facility] Services (RTS) [address], [place], [place] Accepts Medicaid  [place] [address]  [place] [phone] Substance Abuse/Addiction Treatment   [facility] Organization         Address  Phone  Notes  [facility]  [phone]   Domenic Schwab, PhD [address] Arlis Port[place], [place]   [phone] or [phone]   [place]  [place], [place] [phone]   [facility] [address] [place], [place] [phone] Insurance/Medicaid/sponsorship through [facility] and Families [address], [address] [place]                                    [place], [place] [phone] [place] [address], [place] [phone]    Dr. Adele Schilder  [phone]   [facility] Dept. 1) [address] [place] 2) [place] 3)  [place] [address], [place] [phone]  [phone]   [place] [phone] or [phone] (After Hours)

## 2015-03-27 ENCOUNTER — Encounter (HOSPITAL_BASED_OUTPATIENT_CLINIC_OR_DEPARTMENT_OTHER): Payer: Self-pay | Admitting: Emergency Medicine

## 2015-03-27 ENCOUNTER — Emergency Department (HOSPITAL_BASED_OUTPATIENT_CLINIC_OR_DEPARTMENT_OTHER)
Admission: EM | Admit: 2015-03-27 | Discharge: 2015-03-27 | Disposition: A | Discharge status: Home / Self Care | Payer: Self-pay | Attending: Emergency Medicine | Admitting: Emergency Medicine

## 2015-03-27 ENCOUNTER — Emergency Department (HOSPITAL_BASED_OUTPATIENT_CLINIC_OR_DEPARTMENT_OTHER): Payer: Self-pay

## 2015-03-27 DIAGNOSIS — S52692A Other fracture of lower end of left ulna, initial encounter for closed fracture: Secondary | ICD-10-CM | POA: Insufficient documentation

## 2015-03-27 DIAGNOSIS — M79641 Pain in right hand: Secondary | ICD-10-CM

## 2015-03-27 DIAGNOSIS — S6991XA Unspecified injury of right wrist, hand and finger(s), initial encounter: Secondary | ICD-10-CM | POA: Insufficient documentation

## 2015-03-27 DIAGNOSIS — S52202A Unspecified fracture of shaft of left ulna, initial encounter for closed fracture: Secondary | ICD-10-CM

## 2015-03-27 DIAGNOSIS — I10 Essential (primary) hypertension: Secondary | ICD-10-CM | POA: Insufficient documentation

## 2015-03-27 DIAGNOSIS — Y998 Other external cause status: Secondary | ICD-10-CM | POA: Insufficient documentation

## 2015-03-27 DIAGNOSIS — W208XXA Other cause of strike by thrown, projected or falling object, initial encounter: Secondary | ICD-10-CM | POA: Insufficient documentation

## 2015-03-27 DIAGNOSIS — Z79899 Other long term (current) drug therapy: Secondary | ICD-10-CM | POA: Insufficient documentation

## 2015-03-27 DIAGNOSIS — Z791 Long term (current) use of non-steroidal anti-inflammatories (NSAID): Secondary | ICD-10-CM | POA: Insufficient documentation

## 2015-03-27 DIAGNOSIS — Y9389 Activity, other specified: Secondary | ICD-10-CM | POA: Insufficient documentation

## 2015-03-27 DIAGNOSIS — Y9289 Other specified places as the place of occurrence of the external cause: Secondary | ICD-10-CM | POA: Insufficient documentation

## 2015-03-27 DIAGNOSIS — Z720 Tobacco use: Secondary | ICD-10-CM | POA: Insufficient documentation

## 2015-03-27 LAB — BASIC METABOLIC PANEL
Anion gap: 9 (ref 5–15)
BUN: 17 mg/dL (ref 6–20)
CALCIUM: 10.1 mg/dL (ref 8.9–10.3)
CO2: 23 mmol/L (ref 22–32)
CREATININE: 1.15 mg/dL (ref 0.61–1.24)
Chloride: 101 mmol/L (ref 101–111)
GFR calc Af Amer: >60 mL/min (ref >60)
GFR calc non Af Amer: >60 mL/min (ref >60)
GLUCOSE: 131 mg/dL — AB (ref 65–99)
Potassium: 3.8 mmol/L (ref 3.5–5.1)
Sodium: 133 mmol/L — ABNORMAL LOW (ref 135–145)

## 2015-03-27 MED ORDER — TRAMADOL HCL 50 MG PO TABS: 50.0000 mg | ORAL_TABLET | Freq: Four times a day (QID) | ORAL | Status: DC | PRN

## 2015-03-27 MED ORDER — LISINOPRIL 10 MG PO TABS: 10.0000 mg | ORAL_TABLET | Freq: Every day | ORAL | Status: DC

## 2015-03-27 NOTE — Discharge Instructions (Signed)
Forearm Fracture A forearm fracture is a break in one or both of the bones of your arm that are between the elbow and the wrist. Your forearm is made up of two bones:  Radius. This is the bone on the inside of your arm near your thumb.  Ulna. This is the bone on the outside of your arm near your little finger. Middle forearm fractures usually break both the radius and the ulna. Most forearm fractures that involve both the ulna and radius will require surgery. CAUSES Common causes of this type of fracture include:  Falling on an outstretched arm.  Accidents, such as a car or bike accident.  A hard, direct hit to the middle part of your arm. RISK FACTORS You may be at higher risk for this type of fracture if:  You play contact sports.  You have a condition that causes your bones to be weak or thin (osteoporosis). SIGNS AND SYMPTOMS A forearm fracture causes pain immediately after the injury. Other signs and symptoms include:  An abnormal bend or bump in your arm (deformity).  Swelling.  Numbness or tingling.  Tenderness.  Inability to turn your hand from side to side (rotate).  Bruising. DIAGNOSIS Your health care provider may diagnose a forearm fracture based on:  Your symptoms.  Your medical history, including any recent injury.  A physical exam. Your health care provider will look for any deformity and feel for tenderness over the break. Your health care provider will also check whether the bones are out of place.  An X-ray exam to confirm the diagnosis and learn more about the type of fracture. TREATMENT The goals of treatment are to get the bone or bones in proper position for healing and to keep the bones from moving so they will heal over time. Your treatment will depend on many factors, especially the type of fracture that you have.  If the fractured bone or bones:  Are in the correct position (nondisplaced), you may only need to wear a cast or a  splint.  Have a slightly displaced fracture, you may need to have the bones moved back into place manually (closed reduction) before the splint or cast is put on.  You may have a temporary splint before you have a cast. The splint allows room for some swelling. After a few days, a cast can replace the splint.  You may have to wear the cast for 6-8 weeks or as directed by your health care provider.  The cast may be changed after about 3 weeks or as directed by your health care provider.  After your cast is removed, you may need physical therapy to regain full movement in your wrist or elbow.  You may need emergency surgery if you have:  A fractured bone or bones that are out of position (displaced).  A fracture with multiple fragments (comminuted fracture).  A fracture that breaks the skin (open fracture). This type of fracture may require surgical wires, plates, or screws to hold the bone or bones in place.  You may have X-rays every couple of weeks to check on your healing. HOME CARE INSTRUCTIONS If You Have a Cast:  Do not stick anything inside the cast to scratch your skin. Doing that increases your risk of infection.  Check the skin around the cast every day. Report any concerns to your health care provider. You may put lotion on dry skin around the edges of the cast. Do not apply lotion to the skin  underneath the cast. °If You Have a Splint: °· Wear it as directed by your health care provider. Remove it only as directed by your health care provider. °· Loosen the splint if your fingers become numb and tingle, or if they turn cold and blue. °Bathing °· Cover the cast or splint with a watertight plastic bag to protect it from water while you bathe or shower. Do not let the cast or splint get wet. °Managing Pain, Stiffness, and Swelling °· If directed, apply ice to the injured area: °¨ Put ice in a plastic bag. °¨ Place a towel between your skin and the bag. °¨ Leave the ice on for 20  minutes, 2-3 times a day. °· Move your fingers often to avoid stiffness and to lessen swelling. °· Raise the injured area above the level of your heart while you are sitting or lying down. °Driving °· Do not drive or operate heavy machinery while taking pain medicine. °· Do not drive while wearing a cast or splint on a hand that you use for driving. °Activity °· Return to your normal activities as directed by your health care provider. Ask your health care provider what activities are safe for you. °· Perform range-of-motion exercises only as directed by your health care provider. °Safety °· Do not use your injured limb to support your body weight until your health care provider says that you can. °General Instructions °· Do not put pressure on any part of the cast or splint until it is fully hardened. This may take several hours. °· Keep the cast or splint clean and dry. °· Do not use any tobacco products, including cigarettes, chewing tobacco, or electronic cigarettes. Tobacco can delay bone healing. If you need help quitting, ask your health care provider. °· Take medicines only as directed by your health care provider. °· Keep all follow-up visits as directed by your health care provider. This is important. °SEEK MEDICAL CARE IF: °· Your pain medicine is not helping. °· Your cast or splint becomes wet or damaged or suddenly feels too tight. °· Your cast becomes loose. °· You have more severe pain or swelling than you did before the cast. °· You have severe pain when you stretch your fingers. °· You continue to have pain or stiffness in your elbow or your wrist after your cast is removed. °SEEK IMMEDIATE MEDICAL CARE IF: °· You cannot move your fingers. °· You lose feeling in your fingers or your hand. °· Your hand or your fingers turn cold and pale or blue. °· You notice a bad smell coming from your cast. °· You have drainage from underneath your cast. °· You have new stains from blood or drainage that is coming  through your cast. °  °This information is not intended to replace advice given to you by your health care provider. Make sure you discuss any questions you have with your health care provider. °  °Document Released: 04/30/2000 Document Revised: 05/24/2014 Document Reviewed: 12/17/2013 °Elsevier Interactive Patient Education ©2016 [employer] ° °

## 2015-03-27 NOTE — ED Notes (Signed)
Pt requested refill on his b/p meds,
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
## 2015-04-03 ENCOUNTER — Ambulatory Visit (HOSPITAL_BASED_OUTPATIENT_CLINIC_OR_DEPARTMENT_OTHER)
Admission: RE | Admit: 2015-04-03 | Discharge: 2015-04-03 | Disposition: A | Discharge status: Home / Self Care | Payer: Self-pay | Source: Ambulatory Visit | Attending: Family Medicine | Admitting: Family Medicine

## 2015-04-03 ENCOUNTER — Encounter: Payer: Self-pay | Admitting: Family Medicine

## 2015-04-03 ENCOUNTER — Ambulatory Visit (INDEPENDENT_AMBULATORY_CARE_PROVIDER_SITE_OTHER): Payer: Self-pay | Admitting: Family Medicine

## 2015-04-03 VITALS — BP 148/107 | HR 78 | Ht 74.0 in | Wt 220.0 lb

## 2015-04-03 DIAGNOSIS — S52202A Unspecified fracture of shaft of left ulna, initial encounter for closed fracture: Secondary | ICD-10-CM

## 2015-04-03 DIAGNOSIS — S52602A Unspecified fracture of lower end of left ulna, initial encounter for closed fracture: Secondary | ICD-10-CM

## 2015-04-03 DIAGNOSIS — S52602D Unspecified fracture of lower end of left ulna, subsequent encounter for closed fracture with routine healing: Secondary | ICD-10-CM | POA: Insufficient documentation

## 2015-04-03 DIAGNOSIS — I10 Essential (primary) hypertension: Secondary | ICD-10-CM

## 2015-04-03 DIAGNOSIS — X58XXXD Exposure to other specified factors, subsequent encounter: Secondary | ICD-10-CM | POA: Insufficient documentation

## 2015-04-03 MED ORDER — OXYCODONE-ACETAMINOPHEN 7.5-325 MG PO TABS: 1.0000 | ORAL_TABLET | Freq: Four times a day (QID) | ORAL | Status: DC | PRN

## 2015-04-03 NOTE — Patient Instructions (Signed)
Wear the splint at all times. Percocet as needed for severe pain. Follow up with me in 1 week - we will repeat your x-rays at that time.

## 2015-04-04 DIAGNOSIS — I10 Essential (primary) hypertension: Secondary | ICD-10-CM | POA: Insufficient documentation

## 2015-04-04 DIAGNOSIS — S52202A Unspecified fracture of shaft of left ulna, initial encounter for closed fracture: Secondary | ICD-10-CM | POA: Insufficient documentation

## 2015-04-04 NOTE — Assessment & Plan Note (Signed)
independently reviewed his radiographs today and expressed concern that he now has mild angulation at fracture site - measured just more than 10 degrees.  Unfortunately this is angled in a way I cannot do a closed reduction and 10 degrees is generally the maximum acceptable amount of angulation.  We are also limited in that he does not have any insurance coverage to refer out to orthopedics.  We discussed options and went ahead with both sugar tong and posterior splints, plan to return in 1 week and repeat radiographs.  He was given financial aid paperwork as well.
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
## 2015-04-08 ENCOUNTER — Encounter: Payer: Self-pay | Admitting: Family Medicine

## 2015-04-08 ENCOUNTER — Ambulatory Visit (HOSPITAL_BASED_OUTPATIENT_CLINIC_OR_DEPARTMENT_OTHER)
Admission: RE | Admit: 2015-04-08 | Discharge: 2015-04-08 | Disposition: A | Discharge status: Home / Self Care | Payer: Self-pay | Source: Ambulatory Visit | Attending: Family Medicine | Admitting: Family Medicine

## 2015-04-08 ENCOUNTER — Ambulatory Visit (INDEPENDENT_AMBULATORY_CARE_PROVIDER_SITE_OTHER): Payer: Self-pay | Admitting: Family Medicine

## 2015-04-08 VITALS — BP 138/90 | HR 82 | Ht 74.0 in | Wt 220.0 lb

## 2015-04-08 DIAGNOSIS — S52202D Unspecified fracture of shaft of left ulna, subsequent encounter for closed fracture with routine healing: Secondary | ICD-10-CM

## 2015-04-08 DIAGNOSIS — X58XXXD Exposure to other specified factors, subsequent encounter: Secondary | ICD-10-CM | POA: Insufficient documentation

## 2015-04-08 DIAGNOSIS — S52692D Other fracture of lower end of left ulna, subsequent encounter for closed fracture with routine healing: Secondary | ICD-10-CM | POA: Insufficient documentation

## 2015-04-08 NOTE — Patient Instructions (Signed)
Continue wearing the splint and sling for another week. Follow up with me in 1 week for reevaluation, repeat x-rays.

## 2015-04-15 ENCOUNTER — Encounter: Payer: Self-pay | Admitting: Family Medicine

## 2015-04-15 NOTE — Assessment & Plan Note (Signed)
Left distal ulna fracture - independently reviewed his radiographs today  - no changes compared to last radiographs - still about 10 degrees angulated.  Continue splinting and f/u in 1 week.
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
## 2015-04-16 ENCOUNTER — Encounter: Payer: Self-pay | Admitting: Family Medicine

## 2015-04-16 ENCOUNTER — Ambulatory Visit (HOSPITAL_BASED_OUTPATIENT_CLINIC_OR_DEPARTMENT_OTHER)
Admission: RE | Admit: 2015-04-16 | Discharge: 2015-04-16 | Disposition: A | Discharge status: Home / Self Care | Payer: Self-pay | Source: Ambulatory Visit | Attending: Family Medicine | Admitting: Family Medicine

## 2015-04-16 ENCOUNTER — Ambulatory Visit (INDEPENDENT_AMBULATORY_CARE_PROVIDER_SITE_OTHER): Payer: Self-pay | Admitting: Family Medicine

## 2015-04-16 VITALS — BP 132/97 | HR 89 | Ht 74.0 in | Wt 220.0 lb

## 2015-04-16 DIAGNOSIS — S52202D Unspecified fracture of shaft of left ulna, subsequent encounter for closed fracture with routine healing: Secondary | ICD-10-CM | POA: Insufficient documentation

## 2015-04-16 DIAGNOSIS — X58XXXD Exposure to other specified factors, subsequent encounter: Secondary | ICD-10-CM | POA: Insufficient documentation

## 2015-04-16 DIAGNOSIS — S52202G Unspecified fracture of shaft of left ulna, subsequent encounter for closed fracture with delayed healing: Secondary | ICD-10-CM

## 2015-04-16 DIAGNOSIS — S52202A Unspecified fracture of shaft of left ulna, initial encounter for closed fracture: Secondary | ICD-10-CM

## 2015-04-16 NOTE — Progress Notes (Signed)
This encounter was created in error - please disregard.

## 2015-04-16 NOTE — Patient Instructions (Addendum)
Call us tomorrow after you've seen Ricardo Collier as you're going to have to see a surgeon given this is not healing and is displacing more (33mm in past week). We will put a referral in then when given the approval asap. Continue with the sling and splint. Take the percocet as needed.

## 2015-04-17 ENCOUNTER — Ambulatory Visit: Payer: Self-pay | Attending: Family Medicine

## 2015-04-17 ENCOUNTER — Telehealth: Payer: Self-pay | Admitting: Family Medicine
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
## 2015-04-17 NOTE — Assessment & Plan Note (Signed)
independently reviewed his radiographs today - unfortunately despite compliance he has about 3 mm displacement compared to last radiographs a week ago and no callus.  Still with about 10 degrees angulation also.  I'm concerned this will continue displacing and will not heal without surgical intervention.  Continue with splinting.  He is getting cone coverage with plans to refer to ortho to discuss possible ORIF.

## 2015-04-18 ENCOUNTER — Telehealth: Payer: Self-pay | Admitting: Family Medicine

## 2015-04-18 NOTE — Telephone Encounter (Signed)
He does not need a follow-up with me - should be referred to [place].  He filled 40 oxycodone on 11/28 per the database - he cannot have more pain medication.

## 2015-04-18 NOTE — Telephone Encounter (Signed)
Spoke to patient and gave him information provided.

## 2015-04-21 ENCOUNTER — Telehealth: Payer: Self-pay | Admitting: Family Medicine

## 2015-04-21 NOTE — Addendum Note (Signed)
Addended by: Sherrie George F on: 04/21/2015 11:13 AM   Modules accepted: Orders

## 2015-06-17 ENCOUNTER — Telehealth: Payer: Self-pay | Admitting: Internal Medicine

## 2015-06-17 ENCOUNTER — Ambulatory Visit: Payer: Self-pay | Attending: Family Medicine | Admitting: Family Medicine

## 2015-06-17 ENCOUNTER — Encounter: Payer: Self-pay | Admitting: Family Medicine

## 2015-06-17 VITALS — BP 147/80 | HR 63 | Temp 98.2°F | Resp 18 | Ht 73.0 in | Wt 211.0 lb

## 2015-06-17 DIAGNOSIS — Z131 Encounter for screening for diabetes mellitus: Secondary | ICD-10-CM

## 2015-06-17 DIAGNOSIS — S52202G Unspecified fracture of shaft of left ulna, subsequent encounter for closed fracture with delayed healing: Secondary | ICD-10-CM | POA: Insufficient documentation

## 2015-06-17 DIAGNOSIS — H538 Other visual disturbances: Secondary | ICD-10-CM | POA: Insufficient documentation

## 2015-06-17 DIAGNOSIS — I10 Essential (primary) hypertension: Secondary | ICD-10-CM | POA: Insufficient documentation

## 2015-06-17 DIAGNOSIS — Z79899 Other long term (current) drug therapy: Secondary | ICD-10-CM | POA: Insufficient documentation

## 2015-06-17 LAB — POCT GLYCOSYLATED HEMOGLOBIN (HGB A1C): Hemoglobin A1C: 5

## 2015-06-17 MED ORDER — LISINOPRIL 10 MG PO TABS: 10.0000 mg | ORAL_TABLET | Freq: Every day | ORAL | Status: DC

## 2015-06-17 MED FILL — LISINOPRIL 10 MG TABLET: 10 | 30 days supply | Qty: 30 | Fill #0
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
## 2015-06-17 NOTE — Patient Instructions (Signed)
Hypertension Hypertension, commonly called high blood pressure, is when the force of blood pumping through your arteries is too strong. Your arteries are the blood vessels that carry blood from your heart throughout your body. A blood pressure reading consists of a higher number over a lower number, such as 110/72. The higher number (systolic) is the pressure inside your arteries when your heart pumps. The lower number (diastolic) is the pressure inside your arteries when your heart relaxes. Ideally you want your blood pressure below 120/80. Hypertension forces your heart to work harder to pump blood. Your arteries may become narrow or stiff. Having untreated or uncontrolled hypertension can cause heart attack, stroke, kidney disease, and other problems. RISK FACTORS Some risk factors for high blood pressure are controllable. Others are not.  Risk factors you cannot control include:   Race. You may be at higher risk if you are African American.  Age. Risk increases with age.  Gender. Men are at higher risk than women before age 45 years. After age 65, women are at higher risk than men. Risk factors you can control include:  Not getting enough exercise or physical activity.  Being overweight.  Getting too much fat, sugar, calories, or salt in your diet.  Drinking too much alcohol. SIGNS AND SYMPTOMS Hypertension does not usually cause signs or symptoms. Extremely high blood pressure (hypertensive crisis) may cause headache, anxiety, shortness of breath, and nosebleed. DIAGNOSIS To check if you have hypertension, your health care provider will measure your blood pressure while you are seated, with your arm held at the level of your heart. It should be measured at least twice using the same arm. Certain conditions can cause a difference in blood pressure between your right and left arms. A blood pressure reading that is higher than normal on one occasion does not mean that you need treatment. If  it is not clear whether you have high blood pressure, you may be asked to return on a different day to have your blood pressure checked again. Or, you may be asked to monitor your blood pressure at home for 1 or more weeks. TREATMENT Treating high blood pressure includes making lifestyle changes and possibly taking medicine. Living a healthy lifestyle can help lower high blood pressure. You may need to change some of your habits. Lifestyle changes may include:  Following the DASH diet. This diet is high in fruits, vegetables, and whole grains. It is low in salt, red meat, and added sugars.  Keep your sodium intake below 2,300 mg per day.  Getting at least 30-45 minutes of aerobic exercise at least 4 times per week.  Losing weight if necessary.  Not smoking.  Limiting alcoholic beverages.  Learning ways to reduce stress. Your health care provider may prescribe medicine if lifestyle changes are not enough to get your blood pressure under control, and if one of the following is true:  You are 18-59 years of age and your systolic blood pressure is above 140.  You are 60 years of age or older, and your systolic blood pressure is above 150.  Your diastolic blood pressure is above 90.  You have diabetes, and your systolic blood pressure is over 140 or your diastolic blood pressure is over 90.  You have kidney disease and your blood pressure is above 140/90.  You have heart disease and your blood pressure is above 140/90. Your personal target blood pressure may vary depending on your medical conditions, your age, and other factors. HOME CARE INSTRUCTIONS    Have your blood pressure rechecked as directed by your health care provider.   Take medicines only as directed by your health care provider. Follow the directions carefully. Blood pressure medicines must be taken as prescribed. The medicine does not work as well when you skip doses. Skipping doses also puts you at risk for  problems.  Do not smoke.   Monitor your blood pressure at home as directed by your health care provider. SEEK MEDICAL CARE IF:   You think you are having a reaction to medicines taken.  You have recurrent headaches or feel dizzy.  You have swelling in your ankles.  You have trouble with your vision. SEEK IMMEDIATE MEDICAL CARE IF:  You develop a severe headache or confusion.  You have unusual weakness, numbness, or feel faint.  You have severe chest or abdominal pain.  You vomit repeatedly.  You have trouble breathing. MAKE SURE YOU:   Understand these instructions.  Will watch your condition.  Will get help right away if you are not doing well or get worse.   This information is not intended to replace advice given to you by your health care provider. Make sure you discuss any questions you have with your health care provider.   Document Released: 05/03/2005 Document Revised: 09/17/2014 Document Reviewed: 02/23/2013 Elsevier Interactive Patient Education 2016 [employer]  

## 2015-06-17 NOTE — Telephone Encounter (Signed)
Patient is requesting refill for lisinopril. Please follow up. Patient instructed to try [facility] to get primary care doctor. Ricardo Collier is not accepting new patients at the moment. Please follow up.

## 2015-06-18 NOTE — Progress Notes (Signed)
Subjective:  Patient ID: Ricardo Collier, male    DOB: [birth year]  Age: 50 y.o. MRN: [id number]  CC: Hypertension   HPI Ricardo Collier presents to establish care. He has a history of hypertension and was previously on antihypertensive (he is unable to recall the name).  He also sustained a left ulnar fracture in 03/2015 for which he had to wear a cast for a couple of weeks and is currently seeing orthopedics.  Today he complains of blurry vision and is unsure of the duration and has no history of diabetes mellitus. Denies pain in his eyes or discharge from his eyes.  Outpatient Prescriptions Prior to Visit  Medication Sig Dispense Refill  . lisinopril (PRINIVIL,ZESTRIL) 10 MG tablet Take 1 tablet (10 mg total) by mouth daily. (Patient not taking: Reported on 06/17/2015) 30 tablet 2  . oxyCODONE-acetaminophen (PERCOCET) 7.5-325 MG tablet Take 1 tablet by mouth every 6 (six) hours as needed for severe pain. (Patient not taking: Reported on 06/17/2015) 40 tablet 0   No facility-administered medications prior to visit.    ROS Review of Systems  Constitutional: Negative for activity change and appetite change.  HENT: Negative for sinus pressure and sore throat.   Eyes:       Blurry vision  Respiratory: Negative for chest tightness, shortness of breath and wheezing.   Cardiovascular: Negative for chest pain and palpitations.  Gastrointestinal: Negative for abdominal pain, constipation and abdominal distention.  Genitourinary: Negative.   Musculoskeletal:       Left forearm pain  Psychiatric/Behavioral: Negative for behavioral problems and dysphoric mood.    Objective:  BP 147/80 mmHg  Pulse 63  Temp(Src) 98.2 F (36.8 C) (Oral)  Resp 18  Ht 6\' 1"  (1.854 m)  Wt 211 lb (95.709 kg)  BMI 27.84 kg/m2  SpO2 96%  BP/Weight 06/17/2015 04/16/2015 123XX123  Systolic BP Q000111Q Q000111Q 0000000  Diastolic BP 80 97 90  Wt. (Lbs) 211 220 220  BMI 27.84 28.23 28.23    Physical Exam    Constitutional: He is oriented to person, place, and time. He appears well-developed and well-nourished.  Cardiovascular: Normal rate, normal heart sounds and intact distal pulses.   No murmur heard. Pulmonary/Chest: Effort normal and breath sounds normal. He has no wheezes. He has no rales. He exhibits no tenderness.  Abdominal: Soft. Bowel sounds are normal. He exhibits no distension and no mass. There is no tenderness.  Musculoskeletal: Normal range of motion.  Left forearm with bony prominence on ulnar aspect of arm which is mildly tender  Neurological: He is alert and oriented to person, place, and time.     Assessment & Plan:   1. Diabetes mellitus screening A1c 5.0-normal - HgB A1c  2. Essential hypertension, benign Uncontrolled. Low-sodium, DASH diet - lisinopril (PRINIVIL,ZESTRIL) 10 MG tablet; Take 1 tablet (10 mg total) by mouth daily.  Dispense: 30 tablet; Refill: 2 - Flu Vaccine QUAD 36+ mos PF IM (Fluarix & Fluzone Quad PF)  3. Left ulnar fracture, closed, with delayed healing, subsequent encounter Managed by orthopedic  4. Blurry vision, bilateral Visual acuity is 20/20 bilaterally. He has been advised to seek care with an optometrist or ophthalmologist   Meds ordered this encounter  Medications  . lisinopril (PRINIVIL,ZESTRIL) 10 MG tablet    Sig: Take 1 tablet (10 mg total) by mouth daily.    Dispense:  30 tablet    Refill:  2    Follow-up: Return in about 3 weeks (around 07/08/2015) for  complete physical exam and fasting labs.   Arnoldo Morale MD

## 2015-06-18 NOTE — Telephone Encounter (Signed)
Patient was seen in office on 06/17/15. Medications were refilled.

## 2015-06-26 NOTE — Telephone Encounter (Signed)
Finished

## 2015-07-07 ENCOUNTER — Ambulatory Visit: Payer: Self-pay

## 2015-07-07 ENCOUNTER — Ambulatory Visit: Payer: Self-pay | Attending: Family Medicine | Admitting: Family Medicine

## 2015-07-07 ENCOUNTER — Encounter: Payer: Self-pay | Admitting: Family Medicine

## 2015-07-07 VITALS — BP 150/90 | HR 72 | Temp 98.1°F | Resp 14 | Ht 73.0 in | Wt 207.8 lb

## 2015-07-07 DIAGNOSIS — I10 Essential (primary) hypertension: Secondary | ICD-10-CM

## 2015-07-07 DIAGNOSIS — F17200 Nicotine dependence, unspecified, uncomplicated: Secondary | ICD-10-CM

## 2015-07-07 DIAGNOSIS — G629 Polyneuropathy, unspecified: Secondary | ICD-10-CM

## 2015-07-07 DIAGNOSIS — B353 Tinea pedis: Secondary | ICD-10-CM

## 2015-07-07 DIAGNOSIS — F17210 Nicotine dependence, cigarettes, uncomplicated: Secondary | ICD-10-CM | POA: Insufficient documentation

## 2015-07-07 DIAGNOSIS — Z0001 Encounter for general adult medical examination with abnormal findings: Secondary | ICD-10-CM | POA: Insufficient documentation

## 2015-07-07 DIAGNOSIS — Z0000 Encounter for general adult medical examination without abnormal findings: Secondary | ICD-10-CM

## 2015-07-07 LAB — COMPLETE METABOLIC PANEL WITH GFR
ALT: 25 U/L (ref 9–46)
AST: 26 U/L (ref 10–35)
Albumin: 4 g/dL (ref 3.6–5.1)
Alkaline Phosphatase: 77 U/L (ref 40–115)
BILIRUBIN TOTAL: 1 mg/dL (ref 0.2–1.2)
BUN: 8 mg/dL (ref 7–25)
CALCIUM: 9.6 mg/dL (ref 8.6–10.3)
CHLORIDE: 102 mmol/L (ref 98–110)
CO2: 30 mmol/L (ref 20–31)
CREATININE: 1.08 mg/dL (ref 0.70–1.33)
GFR, Est African American: >89 mL/min (ref >=60)
GFR, Est Non African American: 79 mL/min (ref >=60)
Glucose, Bld: 101 mg/dL — ABNORMAL HIGH (ref 65–99)
Potassium: 5.1 mmol/L (ref 3.5–5.3)
Sodium: 140 mmol/L (ref 135–146)
TOTAL PROTEIN: 7.1 g/dL (ref 6.1–8.1)

## 2015-07-07 LAB — LIPID PANEL
CHOLESTEROL: 146 mg/dL (ref 125–200)
HDL: 36 mg/dL — ABNORMAL LOW (ref >=40)
LDL Cholesterol: 60 mg/dL (ref <130)
TRIGLYCERIDES: 248 mg/dL — AB (ref <150)
Total CHOL/HDL Ratio: 4.1 Ratio (ref <=5.0)
VLDL: 50 mg/dL — ABNORMAL HIGH (ref <30)

## 2015-07-07 LAB — HEPATITIS C ANTIBODY: HCV AB: NEGATIVE

## 2015-07-07 MED ORDER — CLOTRIMAZOLE 1 % EX CREA: 1.0000 "application " | TOPICAL_CREAM | Freq: Two times a day (BID) | CUTANEOUS | Status: DC

## 2015-07-07 MED ORDER — LISINOPRIL 20 MG PO TABS: 20.0000 mg | ORAL_TABLET | Freq: Every day | ORAL | Status: DC

## 2015-07-07 MED FILL — CLOTRIMAZOLE 1% CREAM: 1 | 30 days supply | Qty: 57 | Fill #0

## 2015-07-07 MED FILL — LISINOPRIL 20 MG TABLET: 20 | 30 days supply | Qty: 30 | Fill #0

## 2015-07-07 NOTE — Progress Notes (Signed)
Patient here for annual Complains of great toe numbness and feet cool to the touch Feels his HTN med is not helping because he checks his pressure at home and it is elevated

## 2015-07-07 NOTE — Progress Notes (Signed)
Subjective:  Patient ID: Ricardo Collier, male    DOB: Sep 16, 1965  Age: 49 y.o. MRN: [id number]  CC: Annual Exam   HPI Ricardo Collier is a 50 year old male with a history of hypertension who presents today for a complete physical exam. His blood pressure is elevated and he endorses some elevated blood pressures at home as well which go as high as 168/104. He also complains of numbness and tingling in the big toes of his feet for the last few months and has no known history of diabetes mellitus - HbA1c was 5.0 his last visit   Past Medical History  Diagnosis Date  . Hypertension     History reviewed. No pertinent past surgical history.  Social History   Social History  . Marital Status: Single    Spouse Name: N/A  . Number of Children: N/A  . Years of Education: N/A   Occupational History  . Not on file.   Social History Main Topics  . Smoking status: Current Every Day Smoker -- 0.50 packs/day    Types: Cigarettes  . Smokeless tobacco: Not on file  . Alcohol Use: 0.0 oz/week    0 Standard drinks or equivalent per week     Comment: occ- maybe a six pack/month  . Drug Use: No  . Sexual Activity: Not on file   Other Topics Concern  . Not on file   Social History Narrative     Outpatient Prescriptions Prior to Visit  Medication Sig Dispense Refill  . lisinopril (PRINIVIL,ZESTRIL) 10 MG tablet Take 1 tablet (10 mg total) by mouth daily. 30 tablet 2   No facility-administered medications prior to visit.    ROS Review of Systems  Constitutional: Negative for activity change and appetite change.  HENT: Negative for sinus pressure and sore throat.   Eyes: Negative for visual disturbance.  Respiratory: Negative for cough, chest tightness and shortness of breath.   Cardiovascular: Negative for chest pain and leg swelling.  Gastrointestinal: Negative for abdominal pain, diarrhea, constipation and abdominal distention.  Endocrine: Negative.   Genitourinary: Negative  for dysuria.  Musculoskeletal: Negative for myalgias and joint swelling.  Skin: Negative for rash.  Allergic/Immunologic: Negative.   Neurological: Positive for numbness (of the feet). Negative for weakness and light-headedness.  Psychiatric/Behavioral: Negative for suicidal ideas and dysphoric mood.    Objective:  BP 150/90 mmHg  Pulse 72  Temp(Src) 98.1 F (36.7 C)  Resp 14  Ht 6\' 1"  (1.854 m)  Wt 207 lb 12.8 oz (94.257 kg)  BMI 27.42 kg/m2  SpO2 98%  BP/Weight 07/07/2015 06/17/2015 [id number]  Systolic BP Q000111Q Q000111Q Q000111Q  Diastolic BP 90 80 97  Wt. (Lbs) 207.8 211 220  BMI 27.42 27.84 28.23      Physical Exam  Constitutional: He is oriented to person, place, and time. He appears well-developed and well-nourished.  HENT:  Head: Normocephalic and atraumatic.  Right Ear: External ear normal.  Left Ear: External ear normal.  Eyes: Conjunctivae and EOM are normal. Pupils are equal, round, and reactive to light.  Neck: Normal range of motion. Neck supple. No tracheal deviation present.  Cardiovascular: Normal rate, regular rhythm and normal heart sounds.   No murmur heard. Pulmonary/Chest: Effort normal and breath sounds normal. No respiratory distress. He has no wheezes. He exhibits no tenderness.  Abdominal: Soft. Bowel sounds are normal. He exhibits no mass. There is no tenderness.  Genitourinary: Rectum normal and prostate normal.  Normal prostate  Musculoskeletal: Normal range of  motion. He exhibits no edema or tenderness.  Neurological: He is alert and oriented to person, place, and time.  Skin: Skin is warm and dry. Rash (extensive desquamation of skin of the sole bilaterally along with callus formation) noted.  Psychiatric: He has a normal mood and affect.     Assessment & Plan:   1. Essential hypertension, benign Uncontrolled Raised the dose of lisinopril. Low-sodium, DASH diet. - COMPLETE METABOLIC PANEL WITH GFR - Lipid panel - lisinopril (PRINIVIL,ZESTRIL)  20 MG tablet; Take 1 tablet (20 mg total) by mouth daily.  Dispense: 30 tablet; Refill: 2  2. Neuropathy ([place]) Unknown etiology; patient is not diabetic. If vitamin B12 returns normal and he is still symptomatic I will place him on Neurontin. - Vitamin B12  3. Routine general medical examination at a health care facility - HIV antibody - Hepatitis C antibody screen - Ambulatory referral to Gastroenterology  4. Tobacco use disorder Spent 3 minutes counseling on cessation and he is working on quitting.  5. Tinea pedis Placed on topical antifungal   Meds ordered this encounter  Medications  . lisinopril (PRINIVIL,ZESTRIL) 20 MG tablet    Sig: Take 1 tablet (20 mg total) by mouth daily.    Dispense:  30 tablet    Refill:  2    Discontinue previous dose    Follow-up: Return in 4 weeks (on 08/04/2015) for follow up of hypertension and neuropathy.   Arnoldo Morale MD

## 2015-07-07 NOTE — Patient Instructions (Signed)

## 2015-07-08 ENCOUNTER — Telehealth: Payer: Self-pay | Admitting: *Deleted

## 2015-07-08 LAB — VITAMIN B12: Vitamin B-12: 246 pg/mL (ref 200–1100)

## 2015-07-08 LAB — HIV ANTIBODY (ROUTINE TESTING W REFLEX): HIV 1&2 Ab, 4th Generation: NONREACTIVE

## 2015-07-08 NOTE — Telephone Encounter (Signed)
-----   Message from Arnoldo Morale, MD sent at 07/08/2015  2:05 PM EST ----- His labs revealed mildly elevated triglycerides, advised to eat low cholesterol foods and OTC fish oil caps will help. All other labs are normal

## 2015-07-08 NOTE — Telephone Encounter (Signed)
Left HIPAA compliant message on phone numbers listed in chart for patient to return call to clinic

## 2015-07-08 NOTE — Telephone Encounter (Signed)
Patient called in and given results and instructions

## 2015-07-10 ENCOUNTER — Encounter: Payer: Self-pay | Admitting: Internal Medicine

## 2015-08-11 ENCOUNTER — Ambulatory Visit (AMBULATORY_SURGERY_CENTER): Payer: Self-pay | Admitting: *Deleted

## 2015-08-11 VITALS — Ht 74.0 in | Wt 213.0 lb

## 2015-08-11 DIAGNOSIS — Z1211 Encounter for screening for malignant neoplasm of colon: Secondary | ICD-10-CM

## 2015-08-11 MED ORDER — NA SULFATE-K SULFATE-MG SULF 17.5-3.13-1.6 GM/177ML PO SOLN: ORAL | Status: DC

## 2015-08-11 MED FILL — ?LISINOPRIL 20 MG TABLET: 20 | 30 days supply | Qty: 30 | Fill #1

## 2015-08-11 NOTE — Progress Notes (Signed)
Patient denies any allergies to eggs or soy. Patient denies any problems with anesthesia/sedation. Patient denies any oxygen use at home and does not take any diet/weight loss medications. Patient declined EMMI education. 

## 2015-08-13 ENCOUNTER — Ambulatory Visit: Payer: Self-pay | Admitting: Family Medicine

## 2015-08-25 ENCOUNTER — Ambulatory Visit (AMBULATORY_SURGERY_CENTER): Payer: Self-pay | Admitting: Internal Medicine

## 2015-08-25 ENCOUNTER — Encounter: Payer: Self-pay | Admitting: Internal Medicine

## 2015-08-25 VITALS — BP 136/76 | HR 71 | Temp 98.7°F | Resp 16 | Ht 74.0 in | Wt 213.0 lb

## 2015-08-25 DIAGNOSIS — D123 Benign neoplasm of transverse colon: Secondary | ICD-10-CM

## 2015-08-25 DIAGNOSIS — K635 Polyp of colon: Secondary | ICD-10-CM

## 2015-08-25 DIAGNOSIS — Z1211 Encounter for screening for malignant neoplasm of colon: Secondary | ICD-10-CM

## 2015-08-25 MED ORDER — SODIUM CHLORIDE 0.9 % IV SOLN: 500.0000 mL | INTRAVENOUS | Status: DC

## 2015-08-25 NOTE — Op Note (Signed)
[name] Patient Name: Ricardo Collier Procedure Date: 08/25/2015 2:29 PM MRN: [id number] Endoscopist: Ricardo Collier , MD Age: 50 Date of Birth: [birth year] Gender: Male Procedure:                Colonoscopy with cold snare polypectomy -1 Indications:              Screening for colorectal malignant neoplasm Medicines:                Monitored Anesthesia Care Procedure:                Pre-Anesthesia Assessment:                           - Prior to the procedure, a History and Physical                            was performed, and patient medications and                            allergies were reviewed. The patient's tolerance of                            previous anesthesia was also reviewed. The risks                            and benefits of the procedure and the sedation                            options and risks were discussed with the patient.                            All questions were answered, and informed consent                            was obtained. Prior Anticoagulants: The patient has                            taken no previous anticoagulant or antiplatelet                            agents. ASA Grade Assessment: II - A patient with                            mild systemic disease. After reviewing the risks                            and benefits, the patient was deemed in                            satisfactory condition to undergo the procedure.                           After obtaining informed consent, the colonoscope  was passed under direct vision. Throughout the                            procedure, the patient's blood pressure, pulse, and                            oxygen saturations were monitored continuously. The                            Model CF-HQ190L [id number]) scope was introduced                            through the anus and advanced to the the cecum,                            identified by appendiceal  orifice and ileocecal                            valve. The colonoscopy was performed without                            difficulty. The patient tolerated the procedure                            well. The quality of the bowel preparation was                            good. The bowel preparation used was SUPREP. The                            ileocecal valve, appendiceal orifice, and rectum                            were photographed. Scope In: 2:39:10 PM Scope Out: 2:51:46 PM Scope Withdrawal Time: 0 hours 11 minutes 17 seconds  Total Procedure Duration: 0 hours 12 minutes 36 seconds  Findings:                 The digital rectal exam was normal.                           A 4 mm polyp was found in the transverse colon. The                            polyp was pedunculated. The polyp was removed with                            a cold snare. Resection and retrieval were complete.                           Internal hemorrhoids were found during retroflexion.                           The exam was otherwise without abnormality on  direct and retroflexion views. Complications:            No immediate complications. Estimated Blood Loss:     Estimated blood loss: none. Impression:               - One 4 mm polyp in the transverse colon, removed                            with a cold snare. Resected and retrieved.                           - Internal hemorrhoids.                           - The examination was otherwise normal on direct                            and retroflexion views. Recommendation:           - Patient has a contact number available for                            emergencies. The signs and symptoms of potential                            delayed complications were discussed with the                            patient. Return to normal activities tomorrow.                            Written discharge instructions were provided to the                             patient.                           - Resume previous diet.                           - Continue present medications.                           - Await pathology results.                           - Repeat colonoscopy in 5-10 years for                            surveillance, pending pathology. Ricardo Chuck. Henrene Pastor, MD 08/25/2015 2:57:28 PM This report has been signed electronically. CC Letter to:             Ricardo Morale, MD (internal medicine clinic)

## 2015-08-25 NOTE — Progress Notes (Signed)
Called to room to assist during endoscopic procedure.  Patient ID and intended procedure confirmed with present staff. Received instructions for my participation in the procedure from the performing physician.  

## 2015-08-25 NOTE — Patient Instructions (Signed)
YOU HAD AN ENDOSCOPIC PROCEDURE TODAY AT THE [facility]:   Refer to the procedure report that was given to you for any specific questions about what was found during the examination.  If the procedure report does not answer your questions, please call your gastroenterologist to clarify.  If you requested that your care partner not be given the details of your procedure findings, then the procedure report has been included in a sealed envelope for you to review at your convenience later.  YOU SHOULD EXPECT: Some feelings of bloating in the abdomen. Passage of more gas than usual.  Walking can help get rid of the air that was put into your GI tract during the procedure and reduce the bloating. If you had a lower endoscopy (such as a colonoscopy or flexible sigmoidoscopy) you may notice spotting of blood in your stool or on the toilet paper. If you underwent a bowel prep for your procedure, you may not have a normal bowel movement for a few days.  Please Note:  You might notice some irritation and congestion in your nose or some drainage.  This is from the oxygen used during your procedure.  There is no need for concern and it should clear up in a day or so.  SYMPTOMS TO REPORT IMMEDIATELY:   Following lower endoscopy (colonoscopy or flexible sigmoidoscopy):  Excessive amounts of blood in the stool  Significant tenderness or worsening of abdominal pains  Swelling of the abdomen that is new, acute  Fever of 100F or higher    For urgent or emergent issues, a gastroenterologist can be reached at any hour by calling [phone].   DIET: Your first meal following the procedure should be a small meal and then it is ok to progress to your normal diet. Heavy or fried foods are harder to digest and may make you feel nauseous or bloated.  Likewise, meals heavy in dairy and vegetables can increase bloating.  Drink plenty of fluids but you should avoid alcoholic beverages for 24  hours.  ACTIVITY:  You should plan to take it easy for the rest of today and you should NOT DRIVE or use heavy machinery until tomorrow (because of the sedation medicines used during the test).    FOLLOW UP: Our staff will call the number listed on your records the next business day following your procedure to check on you and address any questions or concerns that you may have regarding the information given to you following your procedure. If we do not reach you, we will leave a message.  However, if you are feeling well and you are not experiencing any problems, there is no need to return our call.  We will assume that you have returned to your regular daily activities without incident.  If any biopsies were taken you will be contacted by phone or by letter within the next 1-3 weeks.  Please call us at [phone] if you have not heard about the biopsies in 3 weeks.    SIGNATURES/CONFIDENTIALITY: You and/or your care partner have signed paperwork which will be entered into your electronic medical record.  These signatures attest to the fact that that the information above on your After Visit Summary has been reviewed and is understood.  Full responsibility of the confidentiality of this discharge information lies with you and/or your care-partner   Information on polyps and hemorrhoids given to you today        

## 2015-08-25 NOTE — Progress Notes (Signed)
To recovery, report to [place], RN, VSS 

## 2015-08-26 ENCOUNTER — Telehealth: Payer: Self-pay | Admitting: *Deleted

## 2015-08-26 NOTE — Telephone Encounter (Signed)
  Follow up Call-  Call back number 08/25/2015  Post procedure Call Back phone  # [phone]  Permission to leave phone message Yes     Patient questions:  Do you have a fever, pain , or abdominal swelling? No. Pain Score  0 *  Have you tolerated food without any problems? Yes.    Have you been able to return to your normal activities? Yes.    Do you have any questions about your discharge instructions: Diet   No. Medications  No. Follow up visit  No.  Do you have questions or concerns about your Care? No.  Actions: * If pain score is 4 or above: No action needed, pain <4.

## 2015-09-02 ENCOUNTER — Encounter: Payer: Self-pay | Admitting: Internal Medicine

## 2015-09-12 MED FILL — ?LISINOPRIL 20 MG TABLET: 20 | 30 days supply | Qty: 30 | Fill #2

## 2015-09-16 ENCOUNTER — Emergency Department (HOSPITAL_BASED_OUTPATIENT_CLINIC_OR_DEPARTMENT_OTHER)
Admission: EM | Admit: 2015-09-16 | Discharge: 2015-09-16 | Disposition: A | Discharge status: Home / Self Care | Payer: Self-pay | Attending: Emergency Medicine | Admitting: Emergency Medicine

## 2015-09-16 ENCOUNTER — Emergency Department (HOSPITAL_BASED_OUTPATIENT_CLINIC_OR_DEPARTMENT_OTHER): Payer: Self-pay

## 2015-09-16 ENCOUNTER — Encounter (HOSPITAL_BASED_OUTPATIENT_CLINIC_OR_DEPARTMENT_OTHER): Payer: Self-pay

## 2015-09-16 DIAGNOSIS — F17210 Nicotine dependence, cigarettes, uncomplicated: Secondary | ICD-10-CM | POA: Insufficient documentation

## 2015-09-16 DIAGNOSIS — I10 Essential (primary) hypertension: Secondary | ICD-10-CM | POA: Insufficient documentation

## 2015-09-16 DIAGNOSIS — J069 Acute upper respiratory infection, unspecified: Secondary | ICD-10-CM | POA: Insufficient documentation

## 2015-09-16 DIAGNOSIS — M791 Myalgia: Secondary | ICD-10-CM | POA: Insufficient documentation

## 2015-09-16 MED ORDER — BENZONATATE 100 MG PO CAPS
100.0000 mg | ORAL_CAPSULE | Freq: Once | ORAL | Status: AC
  Administered 2015-09-16: 100 mg via ORAL
  Filled 2015-09-16: qty 1

## 2015-09-16 MED ORDER — KETOROLAC TROMETHAMINE 60 MG/2ML IM SOLN
60.0000 mg | Freq: Once | INTRAMUSCULAR | Status: AC
  Administered 2015-09-16: 60 mg via INTRAMUSCULAR
  Filled 2015-09-16: qty 2

## 2015-09-16 MED ORDER — NAPROXEN 500 MG PO TABS: 500.0000 mg | ORAL_TABLET | Freq: Two times a day (BID) | ORAL | Status: DC

## 2015-09-16 MED ORDER — BENZONATATE 100 MG PO CAPS: 100.0000 mg | ORAL_CAPSULE | Freq: Three times a day (TID) | ORAL | Status: DC

## 2015-09-16 MED ORDER — DEXAMETHASONE SODIUM PHOSPHATE 10 MG/ML IJ SOLN: 10.0000 mg | Freq: Once | INTRAMUSCULAR | Status: DC

## 2015-09-16 MED ORDER — DEXAMETHASONE SODIUM PHOSPHATE 10 MG/ML IJ SOLN
10.0000 mg | Freq: Once | INTRAMUSCULAR | Status: AC
  Administered 2015-09-16: 10 mg via INTRAMUSCULAR
  Filled 2015-09-16: qty 1
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
## 2015-09-16 NOTE — Discharge Instructions (Signed)
You have been seen today for cough and sore throat with congestion. Your imaging showed no abnormalities. Your symptoms are consistent with a viral illness. Viruses do not require antibiotics. Treatment is symptomatic care. Drink plenty of fluids and get plenty of rest. You should be drinking at least a liter of water an hour to stay hydrated. Ibuprofen or Tylenol for pain or fever. Tessalon for cough. Plain Mucinex may help relieve congestion. Warm liquids or Chloraseptic spray may help soothe the sore throat. Follow up with PCP should symptoms continue. Return to ED should symptoms worsen.

## 2015-09-16 NOTE — ED Notes (Signed)
Cough, body aches, head congestion, post nasal drip, sore throat x 2 days-NAD-steady gait

## 2015-09-17 MED FILL — BENZONATATE 100 MG CAPSULE: 100 | 7 days supply | Qty: 21 | Fill #0

## 2015-09-17 MED FILL — NAPROXEN 500 MG TABLET: 500 | 15 days supply | Qty: 30 | Fill #0

## 2015-10-08 ENCOUNTER — Other Ambulatory Visit: Payer: Self-pay | Admitting: Family Medicine

## 2015-10-08 MED FILL — LISINOPRIL 20 MG TABLET: 20 | 30 days supply | Qty: 30 | Fill #0

## 2015-10-16 ENCOUNTER — Emergency Department (HOSPITAL_COMMUNITY): Payer: Self-pay

## 2015-10-16 ENCOUNTER — Emergency Department (HOSPITAL_COMMUNITY)
Admission: EM | Admit: 2015-10-16 | Discharge: 2015-10-16 | Disposition: A | Discharge status: Home / Self Care | Payer: Self-pay | Attending: Emergency Medicine | Admitting: Emergency Medicine

## 2015-10-16 ENCOUNTER — Encounter (HOSPITAL_COMMUNITY): Payer: Self-pay | Admitting: Emergency Medicine

## 2015-10-16 DIAGNOSIS — W109XXA Fall (on) (from) unspecified stairs and steps, initial encounter: Secondary | ICD-10-CM | POA: Insufficient documentation

## 2015-10-16 DIAGNOSIS — Z85038 Personal history of other malignant neoplasm of large intestine: Secondary | ICD-10-CM | POA: Insufficient documentation

## 2015-10-16 DIAGNOSIS — Y9289 Other specified places as the place of occurrence of the external cause: Secondary | ICD-10-CM | POA: Insufficient documentation

## 2015-10-16 DIAGNOSIS — W19XXXA Unspecified fall, initial encounter: Secondary | ICD-10-CM

## 2015-10-16 DIAGNOSIS — Y999 Unspecified external cause status: Secondary | ICD-10-CM | POA: Insufficient documentation

## 2015-10-16 DIAGNOSIS — T148XXA Other injury of unspecified body region, initial encounter: Secondary | ICD-10-CM

## 2015-10-16 DIAGNOSIS — I10 Essential (primary) hypertension: Secondary | ICD-10-CM | POA: Insufficient documentation

## 2015-10-16 DIAGNOSIS — Y939 Activity, unspecified: Secondary | ICD-10-CM | POA: Insufficient documentation

## 2015-10-16 DIAGNOSIS — F1910 Other psychoactive substance abuse, uncomplicated: Secondary | ICD-10-CM

## 2015-10-16 DIAGNOSIS — S300XXA Contusion of lower back and pelvis, initial encounter: Secondary | ICD-10-CM | POA: Insufficient documentation

## 2015-10-16 DIAGNOSIS — F17210 Nicotine dependence, cigarettes, uncomplicated: Secondary | ICD-10-CM | POA: Insufficient documentation

## 2015-10-16 LAB — URINE MICROSCOPIC-ADD ON
BACTERIA UA: NONE SEEN
WBC, UA: NONE SEEN WBC/hpf (ref 0–5)

## 2015-10-16 LAB — I-STAT CHEM 8, ED
BUN: 3 mg/dL — AB (ref 6–20)
CALCIUM ION: 1.1 mmol/L — AB (ref 1.12–1.23)
CREATININE: 1.5 mg/dL — AB (ref 0.61–1.24)
Chloride: 103 mmol/L (ref 101–111)
Glucose, Bld: 115 mg/dL — ABNORMAL HIGH (ref 65–99)
HCT: 49 % (ref 39.0–52.0)
HEMOGLOBIN: 16.7 g/dL (ref 13.0–17.0)
Potassium: 3.7 mmol/L (ref 3.5–5.1)
Sodium: 144 mmol/L (ref 135–145)
TCO2: 25 mmol/L (ref 0–100)

## 2015-10-16 LAB — URINALYSIS, ROUTINE W REFLEX MICROSCOPIC
Bilirubin Urine: NEGATIVE
GLUCOSE, UA: NEGATIVE mg/dL
KETONES UR: NEGATIVE mg/dL
Leukocytes, UA: NEGATIVE
Nitrite: NEGATIVE
PROTEIN: NEGATIVE mg/dL
Specific Gravity, Urine: 1.003 — ABNORMAL LOW (ref 1.005–1.030)
pH: 6.5 (ref 5.0–8.0)

## 2015-10-16 LAB — CBC WITH DIFFERENTIAL/PLATELET
Basophils Absolute: 0.1 10*3/uL (ref 0.0–0.1)
Basophils Relative: 1 %
EOS PCT: 1 %
Eosinophils Absolute: 0.1 10*3/uL (ref 0.0–0.7)
HEMATOCRIT: 46.4 % (ref 39.0–52.0)
Hemoglobin: 16.8 g/dL (ref 13.0–17.0)
LYMPHS PCT: 32 %
Lymphs Abs: 2.7 10*3/uL (ref 0.7–4.0)
MCH: 39.1 pg — AB (ref 26.0–34.0)
MCHC: 36.2 g/dL — AB (ref 30.0–36.0)
MCV: 107.9 fL — AB (ref 78.0–100.0)
MONO ABS: 0.7 10*3/uL (ref 0.1–1.0)
MONOS PCT: 9 %
NEUTROS ABS: 4.9 10*3/uL (ref 1.7–7.7)
Neutrophils Relative %: 57 %
Platelets: 270 10*3/uL (ref 150–400)
RBC: 4.3 MIL/uL (ref 4.22–5.81)
RDW: 15.2 % (ref 11.5–15.5)
WBC: 8.4 10*3/uL (ref 4.0–10.5)

## 2015-10-16 LAB — COMPREHENSIVE METABOLIC PANEL
ALT: 34 U/L (ref 17–63)
ANION GAP: 11 (ref 5–15)
AST: 47 U/L — ABNORMAL HIGH (ref 15–41)
Albumin: 4.2 g/dL (ref 3.5–5.0)
Alkaline Phosphatase: 78 U/L (ref 38–126)
BUN: 6 mg/dL (ref 6–20)
CHLORIDE: 105 mmol/L (ref 101–111)
CO2: 25 mmol/L (ref 22–32)
Calcium: 9 mg/dL (ref 8.9–10.3)
Creatinine, Ser: 1.09 mg/dL (ref 0.61–1.24)
Glucose, Bld: 119 mg/dL — ABNORMAL HIGH (ref 65–99)
POTASSIUM: 3.7 mmol/L (ref 3.5–5.1)
SODIUM: 141 mmol/L (ref 135–145)
Total Bilirubin: 1.2 mg/dL (ref 0.3–1.2)
Total Protein: 7.6 g/dL (ref 6.5–8.1)

## 2015-10-16 LAB — TYPE AND SCREEN
ABO/RH(D): O POS
ANTIBODY SCREEN: NEGATIVE

## 2015-10-16 LAB — RAPID URINE DRUG SCREEN, HOSP PERFORMED
Amphetamines: NOT DETECTED
BARBITURATES: NOT DETECTED
Benzodiazepines: NOT DETECTED
COCAINE: POSITIVE — AB
Opiates: NOT DETECTED
TETRAHYDROCANNABINOL: POSITIVE — AB

## 2015-10-16 LAB — PROTIME-INR
INR: 1.18 (ref 0.00–1.49)
PROTHROMBIN TIME: 14.7 s (ref 11.6–15.2)

## 2015-10-16 LAB — ABO/RH: ABO/RH(D): O POS

## 2015-10-16 LAB — I-STAT CG4 LACTIC ACID, ED
LACTIC ACID, VENOUS: 2.53 mmol/L — AB (ref 0.5–2.0)
Lactic Acid, Venous: 1.6 mmol/L (ref 0.5–2.0)

## 2015-10-16 LAB — APTT: APTT: 33 s (ref 24–37)

## 2015-10-16 LAB — ETHANOL: Alcohol, Ethyl (B): 223 mg/dL — ABNORMAL HIGH (ref <5)

## 2015-10-16 MED ORDER — FENTANYL CITRATE (PF) 100 MCG/2ML IJ SOLN
INTRAMUSCULAR | Status: AC
  Filled 2015-10-16: qty 2

## 2015-10-16 MED ORDER — FENTANYL CITRATE (PF) 100 MCG/2ML IJ SOLN
100.0000 ug | Freq: Once | INTRAMUSCULAR | Status: AC
Start: 2015-10-16 — End: 2015-10-16
  Administered 2015-10-16: 100 ug via INTRAVENOUS

## 2015-10-16 MED ORDER — ONDANSETRON HCL 4 MG/2ML IJ SOLN
4.0000 mg | Freq: Once | INTRAMUSCULAR | Status: AC
  Administered 2015-10-16: 4 mg via INTRAVENOUS
  Filled 2015-10-16: qty 2

## 2015-10-16 MED ORDER — METHOCARBAMOL 500 MG PO TABS: 500.0000 mg | ORAL_TABLET | Freq: Two times a day (BID) | ORAL | Status: DC

## 2015-10-16 MED ORDER — SODIUM CHLORIDE 0.9 % IV BOLUS (SEPSIS)
1000.0000 mL | Freq: Once | INTRAVENOUS | Status: AC
  Administered 2015-10-16: 1000 mL via INTRAVENOUS

## 2015-10-16 MED ORDER — NAPROXEN 500 MG PO TABS
500.0000 mg | ORAL_TABLET | Freq: Two times a day (BID) | ORAL | Status: DC
Start: 2015-10-16 — End: 2016-07-06

## 2015-10-16 MED ORDER — METHOCARBAMOL 1000 MG/10ML IJ SOLN: 1000.0000 mg | Freq: Once | INTRAMUSCULAR | Status: DC

## 2015-10-16 MED ORDER — IOPAMIDOL (ISOVUE-300) INJECTION 61%
100.0000 mL | Freq: Once | INTRAVENOUS | Status: AC | PRN
  Administered 2015-10-16: 100 mL via INTRAVENOUS

## 2015-10-16 MED ORDER — HYDROMORPHONE HCL 1 MG/ML IJ SOLN
1.0000 mg | Freq: Once | INTRAMUSCULAR | Status: AC
  Administered 2015-10-16: 1 mg via INTRAVENOUS
  Filled 2015-10-16: qty 1

## 2015-10-16 MED ORDER — KETOROLAC TROMETHAMINE 30 MG/ML IJ SOLN
30.0000 mg | Freq: Once | INTRAMUSCULAR | Status: AC
  Administered 2015-10-16: 30 mg via INTRAVENOUS
  Filled 2015-10-16: qty 1

## 2015-10-16 MED ORDER — DEXTROSE 5 % IV SOLN
1000.0000 mg | Freq: Once | INTRAVENOUS | Status: AC
  Administered 2015-10-16: 1000 mg via INTRAVENOUS
  Filled 2015-10-16: qty 10

## 2015-10-16 MED ORDER — LIDOCAINE 5 % EX PTCH: 1.0000 | MEDICATED_PATCH | CUTANEOUS | Status: DC

## 2015-10-16 NOTE — ED Notes (Signed)
Informed Dr. Randal Buba of lactic acid of 2.53 @ 0408 by QA
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
## 2015-10-16 NOTE — ED Notes (Signed)
Returned from CT-complaining of 10/10 lower back pain with radiation down left leg-also complaining of nausea/medicated with Zofran and Dilaudid as ordered.  NS bolus infusing.

## 2015-10-16 NOTE — ED Notes (Signed)
Pt noted to have new hematuria.  Pt moved to [facility]B.  Dr. Randal Buba at bedside.  2L NS infusing.  O2 via [place] at 2L.  Pt is A&O x 4 at this time.

## 2015-10-16 NOTE — ED Notes (Signed)
[facility] presents with a 50 yo male that fell down front porch stairs.  No LOC.  ETOH.  Neck and back pain with left flank pain.  Nausea without vomiting.  Pt c/o tingling down left leg.  Pt was ambulatory on scene.  Sinus tachycardia on [facility] monitor, rate 102. KED with C-collar at present time.  CBG 118.

## 2015-10-16 NOTE — ED Notes (Signed)
Bed: [id number] Expected date:  Expected time:  Means of arrival:  Comments: EMS/slipped down 3 porch stairs

## 2015-10-16 NOTE — ED Notes (Signed)
Belongings: -green pants -t-shirt -boxer briefs -belt -pair brown shoes Placed in belongings bag

## 2015-10-16 NOTE — Discharge Instructions (Signed)

## 2016-06-28 ENCOUNTER — Ambulatory Visit: Payer: Self-pay | Attending: Family Medicine

## 2016-07-06 ENCOUNTER — Encounter: Payer: Self-pay | Admitting: Family Medicine

## 2016-07-06 ENCOUNTER — Ambulatory Visit: Payer: Self-pay | Attending: Family Medicine | Admitting: Family Medicine

## 2016-07-06 VITALS — BP 150/85 | HR 77 | Temp 98.0°F | Resp 18 | Ht 74.0 in | Wt 199.0 lb

## 2016-07-06 DIAGNOSIS — F17200 Nicotine dependence, unspecified, uncomplicated: Secondary | ICD-10-CM

## 2016-07-06 DIAGNOSIS — K029 Dental caries, unspecified: Secondary | ICD-10-CM

## 2016-07-06 DIAGNOSIS — G4700 Insomnia, unspecified: Secondary | ICD-10-CM | POA: Insufficient documentation

## 2016-07-06 DIAGNOSIS — M545 Low back pain: Secondary | ICD-10-CM | POA: Insufficient documentation

## 2016-07-06 DIAGNOSIS — J309 Allergic rhinitis, unspecified: Secondary | ICD-10-CM | POA: Insufficient documentation

## 2016-07-06 DIAGNOSIS — B353 Tinea pedis: Secondary | ICD-10-CM

## 2016-07-06 DIAGNOSIS — J302 Other seasonal allergic rhinitis: Secondary | ICD-10-CM

## 2016-07-06 DIAGNOSIS — G4709 Other insomnia: Secondary | ICD-10-CM

## 2016-07-06 DIAGNOSIS — F17210 Nicotine dependence, cigarettes, uncomplicated: Secondary | ICD-10-CM | POA: Insufficient documentation

## 2016-07-06 DIAGNOSIS — Z79899 Other long term (current) drug therapy: Secondary | ICD-10-CM | POA: Insufficient documentation

## 2016-07-06 DIAGNOSIS — F419 Anxiety disorder, unspecified: Secondary | ICD-10-CM

## 2016-07-06 DIAGNOSIS — F1010 Alcohol abuse, uncomplicated: Secondary | ICD-10-CM

## 2016-07-06 DIAGNOSIS — I10 Essential (primary) hypertension: Secondary | ICD-10-CM

## 2016-07-06 LAB — COMPLETE METABOLIC PANEL WITH GFR
ALT: 16 U/L (ref 9–46)
AST: 25 U/L (ref 10–35)
Albumin: 4.6 g/dL (ref 3.6–5.1)
Alkaline Phosphatase: 61 U/L (ref 40–115)
BILIRUBIN TOTAL: 1.3 mg/dL — AB (ref 0.2–1.2)
BUN: 10 mg/dL (ref 7–25)
CO2: 29 mmol/L (ref 20–31)
Calcium: 10.2 mg/dL (ref 8.6–10.3)
Chloride: 101 mmol/L (ref 98–110)
Creat: 1.11 mg/dL (ref 0.70–1.33)
GFR, EST NON AFRICAN AMERICAN: 76 mL/min (ref >=60)
GFR, Est African American: 88 mL/min (ref >=60)
Glucose, Bld: 103 mg/dL — ABNORMAL HIGH (ref 65–99)
Potassium: 3.8 mmol/L (ref 3.5–5.3)
Sodium: 139 mmol/L (ref 135–146)
TOTAL PROTEIN: 7.8 g/dL (ref 6.1–8.1)

## 2016-07-06 LAB — LIPID PANEL W/REFLEX DIRECT LDL
CHOLESTEROL: 220 mg/dL — AB (ref <200)
HDL: 58 mg/dL (ref >40)
LDL-Cholesterol: 126 mg/dL — ABNORMAL HIGH
Non-HDL Cholesterol (Calc): 162 mg/dL — ABNORMAL HIGH (ref <130)
Total CHOL/HDL Ratio: 3.8 Ratio (ref <5.0)
Triglycerides: 215 mg/dL — ABNORMAL HIGH (ref <150)

## 2016-07-06 LAB — TSH: TSH: 3.15 mIU/L (ref 0.40–4.50)

## 2016-07-06 MED ORDER — NAPROXEN 500 MG PO TABS: 500.0000 mg | ORAL_TABLET | Freq: Two times a day (BID) | ORAL | 1 refills | Status: DC

## 2016-07-06 MED ORDER — LISINOPRIL 20 MG PO TABS: 20.0000 mg | ORAL_TABLET | Freq: Every day | ORAL | 3 refills | Status: DC

## 2016-07-06 MED ORDER — TERBINAFINE HCL 1 % EX CREA: 1.0000 "application " | TOPICAL_CREAM | Freq: Two times a day (BID) | CUTANEOUS | 0 refills | Status: DC

## 2016-07-06 MED ORDER — HYDROXYZINE HCL 25 MG PO TABS: 25.0000 mg | ORAL_TABLET | Freq: Three times a day (TID) | ORAL | 3 refills | Status: DC | PRN

## 2016-07-06 MED ORDER — CETIRIZINE HCL 10 MG PO TABS: 10.0000 mg | ORAL_TABLET | Freq: Every day | ORAL | 1 refills | Status: DC

## 2016-07-06 MED ORDER — CYCLOBENZAPRINE HCL 10 MG PO TABS: 10.0000 mg | ORAL_TABLET | Freq: Two times a day (BID) | ORAL | 3 refills | Status: DC | PRN

## 2016-07-06 MED FILL — ?CETIRIZINE HCL 10 MG TABLE: 10 | 30 days supply | Qty: 30 | Fill #0

## 2016-07-06 MED FILL — hydrOXYzine HCL 25 MG TABS: 25 | 30 days supply | Qty: 30 | Fill #0

## 2016-07-06 MED FILL — ?LISINOPRIL 20 MG TABLET: 20 | 30 days supply | Qty: 30 | Fill #0

## 2016-07-06 MED FILL — ?CYCLOBENZAPRINE 10 MG TABL: 10 | 15 days supply | Qty: 30 | Fill #0

## 2016-07-06 NOTE — Progress Notes (Signed)
Patient is here for MEDICATION REFILL  Patient complains of excessive drinking and would like assistance with weaning.  Patient denies pain at this time.  Patient has been out of BP medications for a few weeks now. Patient has not taken medication today. Patient has not eaten today.  Patient denies any suicidal ideations at this time.
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
## 2016-07-06 NOTE — Patient Instructions (Signed)
Allergies, Adult An allergy is when your body's defense system (immune system) overreacts to an otherwise harmless substance (allergen) that you breathe in or eat or something that touches your skin. When you come into contact with something that you are allergic to, your immune system produces certain proteins (antibodies). These proteins cause cells to release chemicals (histamines) that trigger the symptoms of an allergic reaction. Allergies often affect the nasal passages (allergic rhinitis), eyes (allergic conjunctivitis), skin (atopic dermatitis), and stomach. Allergies can be mild or severe. Allergies cannot spread from person to person (are not contagious). They can develop at any age and may be outgrown. What are the causes? Allergies can be caused by any substance that your immune system mistakenly targets as harmful. These may include:  Outdoor allergens, such as pollen, grass, weeds, car exhaust, and mold spores.  Indoor allergens, such as dust, smoke, mold, and pet dander.  Foods, especially peanuts, milk, eggs, fish, shellfish, soy, nuts, and wheat.  Medicines, such as penicillin.  Skin irritants, such as detergents, chemicals, and latex.  Perfume.  Insect bites or stings. What increases the risk? You may be at greater risk of allergies if other people in your family have allergies. What are the signs or symptoms? Symptoms depend on what type of allergy you have. They may include:  Runny, stuffy nose.  Sneezing.  Itchy mouth, ears, or throat.  Postnasal drip.  Sore throat.  Itchy, red, watery, or puffy eyes.  Skin rash or hives.  Stomach pain.  Vomiting.  Diarrhea.  Bloating.  Wheezing or coughing. People with a severe allergy to food, medicine, or an insect bite may have a life-threatening allergic reaction (anaphylaxis). Symptoms of anaphylaxis include:  Hives.  Itching.  Flushed face.  Swollen lips, tongue, or mouth.  Tight or swollen  throat.  Chest pain or tightness in the chest.  Trouble breathing or shortness of breath.  Rapid heartbeat.  Dizziness or fainting.  Vomiting.  Diarrhea.  Pain in the abdomen. How is this diagnosed? This condition is diagnosed based on:  Your symptoms.  Your family and medical history.  A physical exam. You may need to see a health care provider who specializes in treating allergies (allergist). You may also have tests, including:  Skin tests to see which allergens are causing your symptoms, such as:  Skin prick test. In this test, your skin is pricked with a tiny needle and exposed to small amounts of possible allergens to see if your skin reacts.  Intradermal skin test. In this test, a small amount of allergen is injected under your skin to see if your skin reacts.  Patch test. In this test, a small amount of allergen is placed on your skin and then your skin is covered with a bandage. Your health care provider will check your skin after a couple of days to see if a rash has developed.  Blood tests.  Challenges tests. In this test, you inhale a small amount of allergen by mouth to see if you have an allergic reaction. You may also be asked to:  Keep a food diary. A food diary is a record of all the foods and drinks you have in a day and any symptoms you experience.  Practice an elimination diet. An elimination diet involves eliminating specific foods from your diet and then adding them back in one by one to find out if a certain food causes an allergic reaction. How is this treated? Treatment for allergies depends on your symptoms.   Treatment may include:  Cold compresses to soothe itching and swelling.  Eye drops.  Nasal sprays.  Using a saline spray or container (neti pot) to flush out the nose (nasal irrigation). These methods can help clear away mucus and keep the nasal passages moist.  Using a humidifier.  Oral antihistamines or other medicines to block  allergic reaction and inflammation.  Skin creams to treat rashes or itching.  Diet changes to eliminate food allergy triggers.  Repeated exposure to tiny amounts of allergens to build up a tolerance and prevent future allergic reactions (immunotherapy). These include:  Allergy shots.  Oral treatment. This involves taking small doses of an allergen under the tongue (sublingual immunotherapy).  Emergency epinephrine injection (auto-injector) in case of an allergic emergency. This is a self-injectable, pre-measured medicine that must be given within the first few minutes of a serious allergic reaction. Follow these instructions at home:  Avoid known allergens whenever possible.  If you suffer from airborne allergens, wash out your nose daily. You can do this with a saline spray or a neti pot to flush out your nose (nasal irrigation).  Take over-the-counter and prescription medicines only as told by your health care provider.  Keep all follow-up visits as told by your health care provider. This is important.  If you are at risk of a severe allergic reaction (anaphylaxis), keep your auto-injector with you at all times.  If you have ever had anaphylaxis, wear a medical alert bracelet or necklace that states you have a severe allergy. Contact a health care provider if:  Your symptoms do not improve with treatment. Get help right away if:  You have symptoms of anaphylaxis, such as:  Swollen mouth, tongue, or throat.  Pain or tightness in your chest.  Trouble breathing or shortness of breath.  Dizziness or fainting.  Severe abdominal pain, vomiting, or diarrhea. This information is not intended to replace advice given to you by your health care provider. Make sure you discuss any questions you have with your health care provider. Document Released: 07/27/2002 Document Revised: 01/01/2016 Document Reviewed: 11/19/2015 Elsevier Interactive Patient Education  2017 [employer].  

## 2016-07-07 ENCOUNTER — Other Ambulatory Visit: Payer: Self-pay | Admitting: Family Medicine

## 2016-07-07 DIAGNOSIS — E785 Hyperlipidemia, unspecified: Secondary | ICD-10-CM | POA: Insufficient documentation

## 2016-07-07 DIAGNOSIS — E7800 Pure hypercholesterolemia, unspecified: Secondary | ICD-10-CM

## 2016-07-07 MED ORDER — ATORVASTATIN CALCIUM 20 MG PO TABS: 20.0000 mg | ORAL_TABLET | Freq: Every day | ORAL | 3 refills | Status: DC

## 2016-07-09 ENCOUNTER — Telehealth: Payer: Self-pay

## 2016-07-09 ENCOUNTER — Telehealth: Payer: Self-pay | Admitting: *Deleted

## 2016-07-09 NOTE — Telephone Encounter (Signed)
-----   Message from Arnoldo Morale, MD sent at 07/07/2016  9:43 AM EST ----- Labs revealed a normal thyroid, cholesterol is elevated so I have sent a prescription for atorvastatin to his pharmacy.

## 2016-07-09 NOTE — Telephone Encounter (Signed)
Medical Assistant left message on patient's home and cell voicemail. Voicemail states to give a call back to Nubia with [facility] at [phone].  

## 2016-07-09 NOTE — Telephone Encounter (Signed)
Pt returned call to go over lab results pt is aware of results and medication. Pt doesn't have any questions or concerns

## 2016-07-16 ENCOUNTER — Encounter (HOSPITAL_COMMUNITY): Payer: Self-pay | Admitting: Emergency Medicine

## 2016-07-16 ENCOUNTER — Emergency Department (HOSPITAL_COMMUNITY)
Admission: EM | Admit: 2016-07-16 | Discharge: 2016-07-18 | Disposition: A | Discharge status: Home / Self Care | Payer: Self-pay | Attending: Emergency Medicine | Admitting: Emergency Medicine

## 2016-07-16 ENCOUNTER — Ambulatory Visit (HOSPITAL_COMMUNITY)
Admission: RE | Admit: 2016-07-16 | Discharge: 2016-07-16 | Disposition: A | Discharge status: Home / Self Care | Payer: Self-pay | Attending: Psychiatry | Admitting: Psychiatry

## 2016-07-16 ENCOUNTER — Encounter (HOSPITAL_COMMUNITY): Payer: Self-pay | Admitting: Behavioral Health

## 2016-07-16 DIAGNOSIS — F419 Anxiety disorder, unspecified: Secondary | ICD-10-CM | POA: Diagnosis present

## 2016-07-16 DIAGNOSIS — E7800 Pure hypercholesterolemia, unspecified: Secondary | ICD-10-CM

## 2016-07-16 DIAGNOSIS — F17210 Nicotine dependence, cigarettes, uncomplicated: Secondary | ICD-10-CM | POA: Insufficient documentation

## 2016-07-16 DIAGNOSIS — F1024 Alcohol dependence with alcohol-induced mood disorder: Secondary | ICD-10-CM | POA: Diagnosis present

## 2016-07-16 DIAGNOSIS — F32A Depression, unspecified: Secondary | ICD-10-CM | POA: Diagnosis present

## 2016-07-16 DIAGNOSIS — Z79899 Other long term (current) drug therapy: Secondary | ICD-10-CM | POA: Insufficient documentation

## 2016-07-16 DIAGNOSIS — F10120 Alcohol abuse with intoxication, uncomplicated: Secondary | ICD-10-CM | POA: Insufficient documentation

## 2016-07-16 DIAGNOSIS — F329 Major depressive disorder, single episode, unspecified: Secondary | ICD-10-CM | POA: Diagnosis present

## 2016-07-16 DIAGNOSIS — R45850 Homicidal ideations: Secondary | ICD-10-CM

## 2016-07-16 DIAGNOSIS — F1010 Alcohol abuse, uncomplicated: Secondary | ICD-10-CM | POA: Diagnosis present

## 2016-07-16 DIAGNOSIS — I10 Essential (primary) hypertension: Secondary | ICD-10-CM | POA: Insufficient documentation

## 2016-07-16 HISTORY — DX: Major depressive disorder, single episode, unspecified: F32.9

## 2016-07-16 HISTORY — DX: Depression, unspecified: F32.A

## 2016-07-16 LAB — CBC
HCT: 43.4 % (ref 39.0–52.0)
Hemoglobin: 16.3 g/dL (ref 13.0–17.0)
MCH: 39.1 pg — AB (ref 26.0–34.0)
MCHC: 37.6 g/dL — ABNORMAL HIGH (ref 30.0–36.0)
MCV: 104.1 fL — ABNORMAL HIGH (ref 78.0–100.0)
PLATELETS: 266 10*3/uL (ref 150–400)
RBC: 4.17 MIL/uL — ABNORMAL LOW (ref 4.22–5.81)
RDW: 13.4 % (ref 11.5–15.5)
WBC: 6.8 10*3/uL (ref 4.0–10.5)

## 2016-07-16 LAB — COMPREHENSIVE METABOLIC PANEL
ALT: 20 U/L (ref 17–63)
AST: 27 U/L (ref 15–41)
Albumin: 4.3 g/dL (ref 3.5–5.0)
Alkaline Phosphatase: 74 U/L (ref 38–126)
Anion gap: 11 (ref 5–15)
BUN: 8 mg/dL (ref 6–20)
CALCIUM: 9.3 mg/dL (ref 8.9–10.3)
CHLORIDE: 103 mmol/L (ref 101–111)
CO2: 24 mmol/L (ref 22–32)
CREATININE: 1.04 mg/dL (ref 0.61–1.24)
Glucose, Bld: 106 mg/dL — ABNORMAL HIGH (ref 65–99)
Potassium: 3.6 mmol/L (ref 3.5–5.1)
Sodium: 138 mmol/L (ref 135–145)
TOTAL PROTEIN: 8.1 g/dL (ref 6.5–8.1)
Total Bilirubin: 0.7 mg/dL (ref 0.3–1.2)

## 2016-07-16 LAB — RAPID URINE DRUG SCREEN, HOSP PERFORMED
Amphetamines: NOT DETECTED
Barbiturates: NOT DETECTED
Benzodiazepines: NOT DETECTED
Cocaine: POSITIVE — AB
OPIATES: NOT DETECTED
Tetrahydrocannabinol: POSITIVE — AB

## 2016-07-16 LAB — ETHANOL: ALCOHOL ETHYL (B): 166 mg/dL — AB (ref <5)

## 2016-07-16 LAB — ACETAMINOPHEN LEVEL: Acetaminophen (Tylenol), Serum: <10 ug/mL — ABNORMAL LOW (ref 10–30)

## 2016-07-16 LAB — SALICYLATE LEVEL

## 2016-07-16 MED ORDER — VITAMIN B-1 100 MG PO TABS
100.0000 mg | ORAL_TABLET | Freq: Every day | ORAL | Status: DC
  Administered 2016-07-16 – 2016-07-18 (×3): 100 mg via ORAL
  Filled 2016-07-16 (×3): qty 1

## 2016-07-16 MED ORDER — HYDROXYZINE HCL 25 MG PO TABS
25.0000 mg | ORAL_TABLET | Freq: Three times a day (TID) | ORAL | Status: DC | PRN
  Administered 2016-07-17 – 2016-07-18 (×2): 25 mg via ORAL
  Filled 2016-07-16 (×2): qty 1

## 2016-07-16 MED ORDER — LORAZEPAM 1 MG PO TABS
1.0000 mg | ORAL_TABLET | Freq: Three times a day (TID) | ORAL | Status: DC | PRN
  Administered 2016-07-18 (×2): 1 mg via ORAL
  Filled 2016-07-16 (×2): qty 1

## 2016-07-16 MED ORDER — LORAZEPAM 1 MG PO TABS
0.0000 mg | ORAL_TABLET | Freq: Four times a day (QID) | ORAL | Status: DC
  Administered 2016-07-16 – 2016-07-17 (×2): 1 mg via ORAL
  Filled 2016-07-16 (×3): qty 1

## 2016-07-16 MED ORDER — ONDANSETRON HCL 4 MG PO TABS
4.0000 mg | ORAL_TABLET | Freq: Three times a day (TID) | ORAL | Status: DC | PRN
  Administered 2016-07-16: 4 mg via ORAL
  Filled 2016-07-16: qty 1

## 2016-07-16 MED ORDER — LORATADINE 10 MG PO TABS
10.0000 mg | ORAL_TABLET | Freq: Every day | ORAL | Status: DC
  Administered 2016-07-16 – 2016-07-18 (×3): 10 mg via ORAL
  Filled 2016-07-16 (×4): qty 1

## 2016-07-16 MED ORDER — NICOTINE 21 MG/24HR TD PT24
21.0000 mg | MEDICATED_PATCH | Freq: Every day | TRANSDERMAL | Status: DC
  Administered 2016-07-16 – 2016-07-18 (×3): 21 mg via TRANSDERMAL
  Filled 2016-07-16 (×3): qty 1

## 2016-07-16 MED ORDER — LISINOPRIL 20 MG PO TABS
20.0000 mg | ORAL_TABLET | Freq: Every day | ORAL | Status: DC
  Administered 2016-07-16 – 2016-07-18 (×3): 20 mg via ORAL
  Filled 2016-07-16 (×3): qty 1

## 2016-07-16 MED ORDER — LORAZEPAM 1 MG PO TABS: 0.0000 mg | ORAL_TABLET | Freq: Two times a day (BID) | ORAL | Status: DC

## 2016-07-16 MED ORDER — ZOLPIDEM TARTRATE 5 MG PO TABS: 5.0000 mg | ORAL_TABLET | Freq: Every evening | ORAL | Status: DC | PRN

## 2016-07-16 MED ORDER — IBUPROFEN 200 MG PO TABS
600.0000 mg | ORAL_TABLET | Freq: Three times a day (TID) | ORAL | Status: DC | PRN
  Administered 2016-07-17 – 2016-07-18 (×2): 600 mg via ORAL
  Filled 2016-07-16 (×2): qty 3

## 2016-07-16 MED ORDER — ACETAMINOPHEN 325 MG PO TABS: 650.0000 mg | ORAL_TABLET | ORAL | Status: DC | PRN

## 2016-07-16 MED ORDER — ATORVASTATIN CALCIUM 20 MG PO TABS
20.0000 mg | ORAL_TABLET | Freq: Every day | ORAL | Status: DC
  Administered 2016-07-16 – 2016-07-18 (×3): 20 mg via ORAL
  Filled 2016-07-16 (×3): qty 1

## 2016-07-16 MED ORDER — THIAMINE HCL 100 MG/ML IJ SOLN: 100.0000 mg | Freq: Every day | INTRAMUSCULAR | Status: DC

## 2016-07-16 NOTE — ED Notes (Signed)
Pt's mother into see 

## 2016-07-16 NOTE — ED Notes (Signed)
Hourly rounding reveals patient sleeping in room. No complaints, stable, in no acute distress. Q15 minute rounds and monitoring via Security Cameras to continue. 
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
## 2016-07-16 NOTE — ED Notes (Signed)
Report to include situation, background, assessment and recommendations from Janie RN. Patient sleeping, respirations regular and unlabored. Q15 minute rounds and security camera observation to continue.   

## 2016-07-16 NOTE — ED Notes (Signed)
Security into wand pt, pt changed into scrubs immediately prior to arrival

## 2016-07-16 NOTE — ED Notes (Signed)
Eating supper, nad.

## 2016-07-16 NOTE — ED Triage Notes (Signed)
With triage pt denies SI/HI or A/VH. Pt questioned why Behavioral Health had him evaluated here; pt verbalizes "they said I had some paperwork." Pt denies Xanax use. Pt does not give any information to this writer regarding reason for transport/evaluation.

## 2016-07-16 NOTE — ED Triage Notes (Signed)
Per note from [employer] pt SI/HI and attempt SI with Xanax. Pt initially requested inpatient treated but then became restless demanding discharge.

## 2016-07-16 NOTE — ED Notes (Signed)
Hourly rounding reveals patient in room. No complaints, stable, in no acute distress. Q15 minute rounds and monitoring via Security Cameras to continue. 

## 2016-07-16 NOTE — ED Notes (Signed)
Bed: [facility] Expected date:  Expected time:  Means of arrival:  Comments: RM 12
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
## 2016-07-16 NOTE — ED Notes (Signed)
Pt ambulatory w/o difficulty to [facility] accompanied by [employer]

## 2016-07-16 NOTE — ED Notes (Signed)
Bed: [id number] Expected date:  Expected time:  Means of arrival:  Comments: 
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
## 2016-07-17 ENCOUNTER — Encounter (HOSPITAL_COMMUNITY): Payer: Self-pay | Admitting: Registered Nurse

## 2016-07-17 DIAGNOSIS — F329 Major depressive disorder, single episode, unspecified: Secondary | ICD-10-CM | POA: Diagnosis present

## 2016-07-17 DIAGNOSIS — Z79899 Other long term (current) drug therapy: Secondary | ICD-10-CM

## 2016-07-17 DIAGNOSIS — F411 Generalized anxiety disorder: Secondary | ICD-10-CM

## 2016-07-17 DIAGNOSIS — F419 Anxiety disorder, unspecified: Secondary | ICD-10-CM

## 2016-07-17 DIAGNOSIS — F1024 Alcohol dependence with alcohol-induced mood disorder: Secondary | ICD-10-CM

## 2016-07-17 DIAGNOSIS — F1490 Cocaine use, unspecified, uncomplicated: Secondary | ICD-10-CM

## 2016-07-17 DIAGNOSIS — F32A Depression, unspecified: Secondary | ICD-10-CM | POA: Diagnosis present

## 2016-07-17 DIAGNOSIS — F17210 Nicotine dependence, cigarettes, uncomplicated: Secondary | ICD-10-CM

## 2016-07-17 MED ORDER — GABAPENTIN 300 MG PO CAPS
300.0000 mg | ORAL_CAPSULE | Freq: Every day | ORAL | Status: DC
  Administered 2016-07-17: 300 mg via ORAL
  Filled 2016-07-17: qty 1

## 2016-07-17 NOTE — ED Notes (Signed)
Hourly rounding reveals patient sleeping in room. No complaints, stable, in no acute distress. Q15 minute rounds and monitoring via Security Cameras to continue. 

## 2016-07-17 NOTE — ED Notes (Signed)
Pt is calm and cooperative.  He is compliant with medication.  He denies S/I, H/I, and AVH.  15 minute checks and video monitoring continue.

## 2016-07-17 NOTE — Progress Notes (Signed)
Per psychiatrist request, CSW contacted patient's mother Ricardo Collier [phone]) to inquire about any safety concerns that she has about patient. Patient's mother reported that she feels like patient will start drinking and using drugs again if he goes back home. Patient's mother reported that she wants patient to go to rehab and that this is the first time patient asked for help. Patient's mother reported that she is not aware of patient receiving any outpatient treatment. CSW thanked patient's mother for collateral information provided. CSW shared information obtained with psychiatrist and NP.

## 2016-07-17 NOTE — ED Notes (Signed)
Hourly rounding reveals patient in room. No complaints, stable, in no acute distress. Q15 minute rounds and monitoring via Security Cameras to continue. 

## 2016-07-17 NOTE — Progress Notes (Signed)
CSW spoke with patient at bedside and inquired about patient's desire for substance abuse treatment resources. Patient reported that he is just sick and tired and needs help. CSW provided patient with psychoeducation about intensive outpatient programs and residential treatment programs. Patient reported that he didn't want to go anywhere for a Jayvion Stefanski time. CSW explained to patient that residential treatment programs vary in terms of time and encouraged patient to consider time in treatment as a part of the process for his desire to become sober. CSW inquired about patient's motivation to stop drinking and using drugs patient reported that he wants something different. CSW positively affirmed patient's motivation and encouraged patient to start calling facilities while he was in the hospital. CSW provided patient with Substance Abuse Treatment resource handout and reminded patient that there is a phone in the lobby that patient can utilize to call facilities. CSW inquired if patient had any other questions or concerns, patient reported none.

## 2016-07-17 NOTE — ED Notes (Signed)
Hourly rounding reveals patient in room. Stable, in no acute distress. Pt. C/o upset stomach. Q15 minute rounds and monitoring via [employer] to continue.
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
## 2016-07-17 NOTE — ED Notes (Signed)
Report to include situation, background, assessment and recommendations from Edie RN. Patient sleeping, respirations regular and unlabored. Q15 minute rounds and security camera observation to continue.   

## 2016-07-18 MED ORDER — ATORVASTATIN CALCIUM 20 MG PO TABS: 20.0000 mg | ORAL_TABLET | Freq: Every day | ORAL | 0 refills | Status: DC

## 2016-07-18 MED ORDER — GABAPENTIN 300 MG PO CAPS: 300.0000 mg | ORAL_CAPSULE | Freq: Three times a day (TID) | ORAL | 0 refills | Status: DC

## 2016-07-18 MED ORDER — HYDROXYZINE HCL 25 MG PO TABS: 25.0000 mg | ORAL_TABLET | Freq: Three times a day (TID) | ORAL | 0 refills | Status: DC | PRN

## 2016-07-18 NOTE — ED Notes (Signed)
Hourly rounding reveals patient sleeping in room. No complaints, stable, in no acute distress. Q15 minute rounds and monitoring via Security Cameras to continue. 

## 2016-07-18 NOTE — [facility] Suicide Risk Assessment (Cosign Needed)
Suicide Risk Assessment  Discharge Assessment   [facility] Discharge Suicide Risk Assessment   Principal Problem: Alcohol dependence with alcohol-induced mood disorder [facility]) Discharge Diagnoses:  Patient Active Problem List   Diagnosis Date Noted  . Alcohol dependence with alcohol-induced mood disorder ([place]) [F10.24] 07/17/2016  . GAD (generalized anxiety disorder) [F41.1] 07/17/2016  . Hyperlipidemia [E78.5] 07/07/2016  . Anxiety [F41.9] 07/06/2016  . Insomnia [G47.00] 07/06/2016  . Alcohol abuse [F10.10] 07/06/2016  . Neuropathy ([place]) [G62.9] 07/07/2015  . Tobacco use disorder [F17.200] 07/07/2015  . Essential hypertension, benign [I10] 04/04/2015  . Left ulnar fracture [S52.202A] 04/04/2015    Total Time spent with patient: 30 minutes  Musculoskeletal: Strength & Muscle Tone: within normal limits Gait & Station: normal Patient leans: N/A  Psychiatric Specialty Exam: Physical Exam  Constitutional: He is oriented to person, place, and time. He appears well-developed and well-nourished.  Neck: Normal range of motion.  Respiratory: Effort normal.  Musculoskeletal: Normal range of motion.  Neurological: He is alert and oriented to person, place, and time.    Review of Systems  Psychiatric/Behavioral: Positive for depression (Stable) and substance abuse. Negative for hallucinations and suicidal ideas (Passive). The patient is nervous/anxious (Stable) and has insomnia (Stable).   All other systems reviewed and are negative.   Blood pressure 118/80, pulse 72, temperature 98.3 F (36.8 C), temperature source Oral, resp. rate 18, weight 90.3 kg (199 lb), SpO2 100 %.Body mass index is 25.55 kg/m.  General Appearance: Fairly Groomed  Eye Contact:  Good  Speech:  Clear and Coherent  Volume:  Normal  Mood:  Anxious  Affect:  Appropriate  Thought Process:  Coherent  Orientation:  Full (Time, Place, and Person)  Thought Content:  Logical  Suicidal Thoughts:  No  Homicidal Thoughts:   No  Memory:  Immediate;   Good Recent;   Good Remote;   Good  Judgement:  Fair  Insight:  Fair  Psychomotor Activity:  Normal  Concentration:  Concentration: Fair and Attention Span: Fair  Recall:  Good  Fund of Knowledge:  Fair  Language:  Good  Akathisia:  No  Handed:  Right  AIMS (if indicated):     Assets:  Communication Skills Desire for Improvement Housing Social Support  ADL's:  Intact  Cognition:  WNL  Sleep:        Mental Status Per Nursing Assessment::   On Admission:     Demographic Factors:  Male, Caucasian, Low socioeconomic status and Living alone  Loss Factors: None  Historical Factors: Family history of mental illness or substance abuse and Impulsivity  Risk Reduction Factors:   Positive social support  Continued Clinical Symptoms:  Alcohol/Substance Abuse/Dependencies  Cognitive Features That Contribute To Risk:  None    Suicide Risk:  Minimal: No identifiable suicidal ideation.  Patients presenting with no risk factors but with morbid ruminations; may be classified as minimal risk based on the severity of the depressive symptoms    Plan Of Care/Follow-up recommendations:  Activity:  As tolerated Diet:  As tolerated  Dahlia Nifong, NP 07/18/2016, 11:29 AM
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
## 2016-07-18 NOTE — ED Notes (Signed)
C/o moderate anxiety r/t etoh withdrawal.  PRN medication requested and received.

## 2016-07-18 NOTE — ED Notes (Signed)
Hourly rounding reveals patient in room. No complaints, stable, in no acute distress. Q15 minute rounds and monitoring via Security Cameras to continue. 

## 2016-07-18 NOTE — ED Notes (Signed)
AA meeting schedule given.  D/C medication education done. Patient verbalized understanding of all instruction.

## 2016-07-18 NOTE — ED Notes (Signed)
Pt. C/o anxiety. 

## 2016-07-19 MED FILL — ATORVASTATIN 20 MG TABLET: 20 | 30 days supply | Qty: 30 | Fill #0

## 2016-08-03 ENCOUNTER — Ambulatory Visit: Payer: Self-pay | Admitting: Family Medicine

## 2016-08-11 ENCOUNTER — Ambulatory Visit: Payer: Self-pay | Admitting: Family Medicine

## 2016-08-16 MED FILL — ?CYCLOBENZAPRINE 10 MG TABL: 10 | 15 days supply | Qty: 30 | Fill #1

## 2016-08-16 MED FILL — hydrOXYzine HCL 25 MG TABS: 25 | 30 days supply | Qty: 30 | Fill #1

## 2016-08-16 MED FILL — ?LISINOPRIL 20 MG TABLET: 20 | 30 days supply | Qty: 30 | Fill #1

## 2016-08-17 MED FILL — ?CETIRIZINE HCL 10 MG TABLE: 10 | 30 days supply | Qty: 30 | Fill #1

## 2016-09-03 ENCOUNTER — Ambulatory Visit: Payer: Self-pay | Admitting: Family Medicine

## 2016-09-22 ENCOUNTER — Other Ambulatory Visit: Payer: Self-pay | Admitting: Family Medicine

## 2016-09-22 MED FILL — ?HYDROXYZINE HCL 25 MG TAB: 25 MG | 30 days supply | Qty: 30 | Fill #2

## 2016-09-22 MED FILL — ?CETIRIZINE HCL 10 MG TABLE: 10 | 30 days supply | Qty: 30 | Fill #0

## 2016-09-22 MED FILL — CYCLOBENZAPRINE 10 MG TAB: 10 | 15 days supply | Qty: 30 | Fill #2

## 2016-09-22 MED FILL — ?LISINOPRIL 20 MG TABLET: 20 | 30 days supply | Qty: 30 | Fill #2

## 2016-09-29 ENCOUNTER — Ambulatory Visit: Payer: Self-pay

## 2016-10-14 ENCOUNTER — Ambulatory Visit: Payer: Self-pay | Admitting: Family Medicine

## 2016-12-30 ENCOUNTER — Encounter: Payer: Self-pay | Admitting: Family Medicine

## 2016-12-30 ENCOUNTER — Ambulatory Visit: Payer: Self-pay | Attending: Family Medicine | Admitting: Licensed Clinical Social Worker

## 2016-12-30 ENCOUNTER — Ambulatory Visit: Payer: Self-pay | Attending: Family Medicine | Admitting: Family Medicine

## 2016-12-30 VITALS — BP 144/86 | HR 75 | Temp 97.9°F | Ht 74.0 in | Wt 201.2 lb

## 2016-12-30 DIAGNOSIS — M545 Low back pain: Secondary | ICD-10-CM | POA: Insufficient documentation

## 2016-12-30 DIAGNOSIS — F1024 Alcohol dependence with alcohol-induced mood disorder: Secondary | ICD-10-CM

## 2016-12-30 DIAGNOSIS — F329 Major depressive disorder, single episode, unspecified: Secondary | ICD-10-CM

## 2016-12-30 DIAGNOSIS — E7800 Pure hypercholesterolemia, unspecified: Secondary | ICD-10-CM

## 2016-12-30 DIAGNOSIS — F17200 Nicotine dependence, unspecified, uncomplicated: Secondary | ICD-10-CM

## 2016-12-30 DIAGNOSIS — Z720 Tobacco use: Secondary | ICD-10-CM | POA: Insufficient documentation

## 2016-12-30 DIAGNOSIS — I10 Essential (primary) hypertension: Secondary | ICD-10-CM

## 2016-12-30 DIAGNOSIS — Z9119 Patient's noncompliance with other medical treatment and regimen: Secondary | ICD-10-CM | POA: Insufficient documentation

## 2016-12-30 DIAGNOSIS — G629 Polyneuropathy, unspecified: Secondary | ICD-10-CM

## 2016-12-30 DIAGNOSIS — F419 Anxiety disorder, unspecified: Secondary | ICD-10-CM

## 2016-12-30 DIAGNOSIS — F32A Depression, unspecified: Secondary | ICD-10-CM

## 2016-12-30 MED ORDER — GABAPENTIN 300 MG PO CAPS: 300.0000 mg | ORAL_CAPSULE | Freq: Three times a day (TID) | ORAL | 3 refills | Status: DC

## 2016-12-30 MED ORDER — NALTREXONE HCL 50 MG PO TABS: 50.0000 mg | ORAL_TABLET | Freq: Every day | ORAL | 3 refills | Status: DC

## 2016-12-30 MED ORDER — ATORVASTATIN CALCIUM 20 MG PO TABS: 20.0000 mg | ORAL_TABLET | Freq: Every day | ORAL | 3 refills | Status: DC

## 2016-12-30 MED ORDER — CYCLOBENZAPRINE HCL 10 MG PO TABS: 10.0000 mg | ORAL_TABLET | Freq: Two times a day (BID) | ORAL | 3 refills | Status: DC | PRN

## 2016-12-30 MED ORDER — HYDROXYZINE HCL 25 MG PO TABS: 25.0000 mg | ORAL_TABLET | Freq: Three times a day (TID) | ORAL | 3 refills | Status: DC | PRN

## 2016-12-30 MED ORDER — CETIRIZINE HCL 10 MG PO TABS: 10.0000 mg | ORAL_TABLET | Freq: Every day | ORAL | 1 refills | Status: DC

## 2016-12-30 MED ORDER — BUPROPION HCL ER (SR) 150 MG PO TB12: 150.0000 mg | ORAL_TABLET | Freq: Two times a day (BID) | ORAL | 3 refills | Status: DC

## 2016-12-30 MED ORDER — LISINOPRIL 20 MG PO TABS: 20.0000 mg | ORAL_TABLET | Freq: Every day | ORAL | 3 refills | Status: DC

## 2016-12-30 MED FILL — ?ATORVASTATIN 20 MG TABLET: 20 | 30 days supply | Qty: 30 | Fill #0

## 2016-12-30 MED FILL — ?CETIRIZINE HCL 10 MG TABLE: 10 | 30 days supply | Qty: 30 | Fill #0

## 2016-12-30 MED FILL — GABAPENTIN 300 MG CAPSULE: 300 | 30 days supply | Qty: 90 | Fill #0

## 2016-12-30 MED FILL — LISINOPRIL 20 MG TAB: 20 | 30 days supply | Qty: 30 | Fill #0

## 2016-12-30 MED FILL — BUPROPION SR 150 MG TABLET: 150 | 30 days supply | Qty: 60 | Fill #0

## 2016-12-30 MED FILL — hydrOXYzine HCL 25 MG TABS: 25 | 10 days supply | Qty: 30 | Fill #0

## 2016-12-30 MED FILL — CYCLOBENZAPRINE 10 MG TAB: 10 | 15 days supply | Qty: 30 | Fill #0

## 2016-12-30 NOTE — Patient Instructions (Signed)
Alcohol Abuse and Nutrition Alcohol abuse is any pattern of alcohol consumption that harms your health, relationships, or work. Alcohol abuse can affect how your body breaks down and absorbs nutrients from food by causing your liver to work abnormally. Additionally, many people who abuse alcohol do not eat enough carbohydrates, protein, fat, vitamins, and minerals. This can cause poor nutrition (malnutrition) and a lack of nutrients (nutrient deficiencies), which can lead to further complications. Nutrients that are commonly lacking (deficient) among people who abuse alcohol include:  Vitamins. ? Vitamin A. This is stored in your liver. It is important for your vision, metabolism, and ability to fight off infections (immunity). ? B vitamins. These include vitamins such as folate, thiamin, and niacin. These are important in new cell growth and maintenance. ? Vitamin C. This plays an important role in iron absorption, wound healing, and immunity. ? Vitamin D. This is produced by your liver, but you can also get vitamin D from food. Vitamin D is necessary for your body to absorb and use calcium.  Minerals. ? Calcium. This is important for your bones and your heart and blood vessel (cardiovascular) function. ? Iron. This is important for blood, muscle, and nervous system functioning. ? Magnesium. This plays an important role in muscle and nerve function, and it helps to control blood sugar and blood pressure. ? Zinc. This is important for the normal function of your nervous system and digestive system (gastrointestinal tract).  Nutrition is an essential component of therapy for alcohol abuse. Your health care provider or dietitian will work with you to design a plan that can help restore nutrients to your body and prevent potential complications. What is my plan? Your dietitian may develop a specific diet plan that is based on your condition and any other complications you may have. A diet plan will  commonly include:  A balanced diet. ? Grains: 6-8 oz per day. ? Vegetables: 2-3 cups per day. ? Fruits: 1-2 cups per day. ? Meat and other protein: 5-6 oz per day. ? Dairy: 2-3 cups per day.  Vitamin and mineral supplements.  What do I need to know about alcohol and nutrition?  Consume foods that are high in antioxidants, such as grapes, berries, nuts, green tea, and dark green and orange vegetables. This can help to counteract some of the stress that is placed on your liver by consuming alcohol.  Avoid food and drinks that are high in fat and sugar. Foods such as sugared soft drinks, salty snack foods, and candy contain empty calories. This means that they lack important nutrients such as protein, fiber, and vitamins.  Eat frequent meals and snacks. Try to eat 5-6 small meals each day.  Eat a variety of fresh fruits and vegetables each day. This will help you get plenty of water, fiber, and vitamins in your diet.  Drink plenty of water and other clear fluids. Try to drink at least 48-64 oz (1.5-2 L) of water per day.  If you are a vegetarian, eat a variety of protein-rich foods. Pair whole grains with plant-based proteins at meals and snacks to obtain the greatest nutrient benefit from your food. For example, eat rice with beans, put peanut butter on whole-grain toast, or eat oatmeal with sunflower seeds.  Soak beans and whole grains overnight before cooking. This can help your body to absorb the nutrients more easily.  Include foods fortified with vitamins and minerals in your diet. Commonly fortified foods include milk, orange juice, cereal, and bread.  If you are malnourished, your dietitian may recommend a high-protein, high-calorie diet. This may include: ? 2,000-3,000 calories (kilocalories) per day. ? 70-100 grams of protein per day.  Your health care provider may recommend a complete nutritional supplement beverage. This can help to restore calories, protein, and vitamins to  your body. Depending on your condition, you may be advised to consume this instead of or in addition to meals.  Limit your intake of caffeine. Replace drinks like coffee and black tea with decaffeinated coffee and herbal tea.  Eat a variety of foods that are high in omega fatty acids. These include fish, nuts and seeds, and soybeans. These foods may help your liver to recover and may also stabilize your mood.  Certain medicines may cause changes in your appetite, taste, and weight. Work with your health care provider and dietitian to make any adjustments to your medicines and diet plan.  Include other healthy lifestyle choices in your daily routine. ? Be physically active. ? Get enough sleep. ? Spend time doing activities that you enjoy.  If you are unable to take in enough food and calories by mouth, your health care provider may recommend a feeding tube. This is a tube that passes through your nose and throat, directly into your stomach. Nutritional supplement beverages can be given to you through the feeding tube to help you get the nutrients you need.  Take vitamin or mineral supplements as recommended by your health care provider. What foods can I eat? Grains Enriched pasta. Enriched rice. Fortified whole-grain bread. Fortified whole-grain cereal. Barley. Brown rice. Quinoa. [place]. Vegetables All fresh, frozen, and canned vegetables. Spinach. Kale. Artichoke. Carrots. Winter squash and pumpkin. Sweet potatoes. Broccoli. Cabbage. Cucumbers. Tomatoes. Sweet peppers. Green beans. Peas. Corn. Fruits All fresh and frozen fruits. Berries. Grapes. Mango. Papaya. Guava. Cherries. Apples. Bananas. Peaches. Plums. Pineapple. Watermelon. Cantaloupe. Oranges. Avocado. Meats and Other Protein Sources Beef liver. Lean beef. Pork. Fresh and canned chicken. Fresh fish. Oysters. Sardines. Canned tuna. Shrimp. Eggs with yolks. Nuts and seeds. Peanut butter. Beans and lentils. Soybeans.  Tofu. Dairy Whole, low-fat, and nonfat milk. Whole, low-fat, and nonfat yogurt. Cottage cheese. Sour cream. Hard and soft cheeses. Beverages Water. Herbal tea. Decaffeinated coffee. Decaffeinated green tea. 100% fruit juice. 100% vegetable juice. Instant breakfast shakes. Condiments Ketchup. Mayonnaise. Mustard. Salad dressing. Barbecue sauce. Sweets and Desserts Sugar-free ice cream. Sugar-free pudding. Sugar-free gelatin. Fats and Oils Butter. Vegetable oil, flaxseed oil, olive oil, and walnut oil. Other Complete nutrition shakes. Protein bars. Sugar-free gum. The items listed above may not be a complete list of recommended foods or beverages. Contact your dietitian for more options. What foods are not recommended? Grains Sugar-sweetened breakfast cereals. Flavored instant oatmeal. Fried breads. Vegetables Breaded or deep-fried vegetables. Fruits Dried fruit with added sugar. Candied fruit. Canned fruit in syrup. Meats and Other Protein Sources Breaded or deep-fried meats. Dairy Flavored milks. Fried cheese curds or fried cheese sticks. Beverages Alcohol. Sugar-sweetened soft drinks. Sugar-sweetened tea. Caffeinated coffee and tea. Condiments Sugar. Honey. Agave nectar. Molasses. Sweets and Desserts Chocolate. Cake. Cookies. Candy. Other Potato chips. Pretzels. Salted nuts. Candied nuts. The items listed above may not be a complete list of foods and beverages to avoid. Contact your dietitian for more information. This information is not intended to replace advice given to you by your health care provider. Make sure you discuss any questions you have with your health care provider. Document Released: 02/25/2005 Document Revised: 09/10/2015 Document Reviewed: 12/04/2013 Elsevier Interactive Patient Education  2018 [employer]vier Inc[employer]  

## 2016-12-30 NOTE — BH Specialist Note (Signed)
Integrated Behavioral Health Initial Visit  MRN: [id number] Name: Ricardo Collier   Session Start time: 10:20 AM Session End time: 10:40 AM Total time: 20 minutes  Type of Service: [place] Interpretor:No. Interpretor Name and Language: N/A   Warm Hand Off Completed.       SUBJECTIVE: Ricardo Collier is a 51 y.o. male accompanied by patient. Patient was referred by Dr. Jarold Song for depression, anxiety and substance use. Patient reports the following symptoms/concerns: overwhelming feelings of sadness and worry, difficulty sleeping, low energy, difficulty concentrating, irritability, and ongoing substance use  Duration of problem: 15 years; Severity of problem: severe  OBJECTIVE: Mood: Anxious and Affect: Depressed Risk of harm to self or others: No plan to harm self or others   LIFE CONTEXT: Family and Social: Pt receives support from his mother School/Work: Pt is unemployed. He is open to applying for Financial Assistance Self-Care: Pt reported that he prays. He disclosed ongoing substance use, alcohol (four 22 oz Mike's Hard Lemonade daily) and cigarettes (1.5 packs per day) Life Changes: Pt recently lost his employment, ended a relationship with significant other, and had a pet that passed away  GOALS ADDRESSED: Patient will reduce symptoms of: anxiety and depression and increase knowledge and/or ability of: coping skills and also: Increase adequate support systems for patient/family, Increase motivation to adhere to plan of care and Decrease self-medicating behaviors   INTERVENTIONS: Solution-Focused Strategies, Supportive Counseling and Psychoeducation and/or Health Education  Standardized Assessments completed: GAD-7 and PHQ 2&9  ASSESSMENT: Patient currently experiencing depression and anxiety triggered by financial strain, a recent break-up with significant other, and ongoing substance use. He reports overwhelming feelings of sadness  and worry, difficulty sleeping, low energy, difficulty concentrating, and irritability. He receives limited support. Patient may benefit from psychoeducation, psychotherapy, and medication management. [place] educated pt on how stress and substance use can negatively impact one's physical and mental health. LCSWA discussed benefits of applying healthy coping skills to decrease symptoms. Pt is open to medication management to assist in decreasing tobacco use and improving mental health. He has a scheduled appointment with [facility] to initiate behavioral health services. Pt was strongly encouraged to complete Financial Counseling application to assist with financial strain. LCSWA provided pt with community resources on crisis intervention and substance use treatment.   PLAN: 1. Follow up with behavioral health clinician on : Pt was encouraged to contact LCSWA if symptoms worsen or fail to improve to schedule behavioral appointments at [facility] 2. Behavioral recommendations: LCSWA recommends that pt apply healthy coping skills discussed, comply with medication management, initiate behavioral health services through [facility], and utilize provided resources. Pt is encouraged to schedule follow up appointment with LCSWA 3. Referral(s): [facility] (In Clinic), [facility] (L[facility]/Outside Clinic) and Substance Abuse Program 4. "From scale of 1-10, how likely are you to follow plan?": 7/10  Rebekah Chesterfield, LCSW 01/03/17 2:14 PM
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
## 2017-01-21 MED FILL — CYCLOBENZAPRINE 10 MG TAB: 10 | 15 days supply | Qty: 30 | Fill #1

## 2017-01-21 MED FILL — LISINOPRIL 20 MG TAB: 20 | 30 days supply | Qty: 30 | Fill #1

## 2017-01-21 MED FILL — ?CETIRIZINE HCL 10 MG TABLE: 10 | 30 days supply | Qty: 30 | Fill #1

## 2017-01-21 MED FILL — hydrOXYzine HCL 25 MG TABS: 25 | 10 days supply | Qty: 30 | Fill #1

## 2017-02-10 ENCOUNTER — Encounter: Payer: Self-pay | Admitting: Family Medicine

## 2017-02-10 ENCOUNTER — Ambulatory Visit: Payer: Self-pay | Attending: Family Medicine | Admitting: Family Medicine

## 2017-02-10 VITALS — BP 116/74 | HR 77 | Temp 97.5°F | Ht 74.0 in | Wt 205.6 lb

## 2017-02-10 DIAGNOSIS — F419 Anxiety disorder, unspecified: Secondary | ICD-10-CM

## 2017-02-10 DIAGNOSIS — Z79899 Other long term (current) drug therapy: Secondary | ICD-10-CM | POA: Insufficient documentation

## 2017-02-10 DIAGNOSIS — R5383 Other fatigue: Secondary | ICD-10-CM

## 2017-02-10 DIAGNOSIS — F1024 Alcohol dependence with alcohol-induced mood disorder: Secondary | ICD-10-CM

## 2017-02-10 DIAGNOSIS — F329 Major depressive disorder, single episode, unspecified: Secondary | ICD-10-CM

## 2017-02-10 DIAGNOSIS — I10 Essential (primary) hypertension: Secondary | ICD-10-CM

## 2017-02-10 MED ORDER — TERBINAFINE HCL 1 % EX CREA: 1.0000 "application " | TOPICAL_CREAM | Freq: Two times a day (BID) | CUTANEOUS | 1 refills | Status: DC
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
## 2017-02-10 NOTE — Patient Instructions (Signed)
Major Depressive Disorder, Adult Major depressive disorder (MDD) is a mental health condition. It may also be called clinical depression or unipolar depression. MDD usually causes feelings of sadness, hopelessness, or helplessness. MDD can also cause physical symptoms. It can interfere with work, school, relationships, and other everyday activities. MDD may be mild, moderate, or severe. It may occur once (single episode major depressive disorder) or it may occur multiple times (recurrent major depressive disorder). What are the causes? The exact cause of this condition is not known. MDD is most likely caused by a combination of things, which may include:  Genetic factors. These are traits that are passed along from parent to child.  Individual factors. Your personality, your behavior, and the way you handle your thoughts and feelings may contribute to MDD. This includes personality traits and behaviors learned from others.  Physical factors, such as: ? Differences in the part of your brain that controls emotion. This part of your brain may be different than it is in people who do not have MDD. ? Long-term (chronic) medical or psychiatric illnesses.  Social factors. Traumatic experiences or major life changes may play a role in the development of MDD.  What increases the risk? This condition is more likely to develop in women. The following factors may also make you more likely to develop MDD:  A family history of depression.  Troubled family relationships.  Abnormally low levels of certain brain chemicals.  Traumatic events in childhood, especially abuse or the loss of a parent.  Being under a lot of stress, or long-term stress, especially from upsetting life experiences or losses.  A history of: ? Chronic physical illness. ? Other mental health disorders. ? Substance abuse.  Poor living conditions.  Experiencing social exclusion or discrimination on a regular basis.  What are  the signs or symptoms? The main symptoms of MDD typically include:  Constant depressed or irritable mood.  Loss of interest in things and activities.  MDD symptoms may also include:  Sleeping or eating too much or too little.  Unexplained weight change.  Fatigue or low energy.  Feelings of worthlessness or guilt.  Difficulty thinking clearly or making decisions.  Thoughts of suicide or of harming others.  Physical agitation or weakness.  Isolation.  Severe cases of MDD may also occur with other symptoms, such as:  Delusions or hallucinations, in which you imagine things that are not real (psychotic depression).  Low-level depression that lasts at least a year (chronic depression or persistent depressive disorder).  Extreme sadness and hopelessness (melancholic depression).  Trouble speaking and moving (catatonic depression).  How is this diagnosed? This condition may be diagnosed based on:  Your symptoms.  Your medical history, including your mental health history. This may involve tests to evaluate your mental health. You may be asked questions about your lifestyle, including any drug and alcohol use, and how long you have had symptoms of MDD.  A physical exam.  Blood tests to rule out other conditions.  You must have a depressed mood and at least four other MDD symptoms most of the day, nearly every day in the same 2-week timeframe before your health care provider can confirm a diagnosis of MDD. How is this treated? This condition is usually treated by mental health professionals, such as psychologists, psychiatrists, and clinical social workers. You may need more than one type of treatment. Treatment may include:  Psychotherapy. This is also called talk therapy or counseling. Types of psychotherapy include: ? Cognitive behavioral   therapy (CBT). This type of therapy teaches you to recognize unhealthy feelings, thoughts, and behaviors, and replace them with  positive thoughts and actions. ? Interpersonal therapy (IPT). This helps you to improve the way you relate to and communicate with others. ? Family therapy. This treatment includes members of your family.  Medicine to treat anxiety and depression, or to help you control certain emotions and behaviors.  Lifestyle changes, such as: ? Limiting alcohol and drug use. ? Exercising regularly. ? Getting plenty of sleep. ? Making healthy eating choices. ? Spending more time outdoors.  Treatments involving stimulation of the brain can be used in situations with extremely severe symptoms, or when medicine or other therapies do not work over time. These treatments include electroconvulsive therapy, transcranial magnetic stimulation, and vagal nerve stimulation. Follow these instructions at home: Activity  Return to your normal activities as told by your health care provider.  Exercise regularly and spend time outdoors as told by your health care provider. General instructions  Take over-the-counter and prescription medicines only as told by your health care provider.  Do not drink alcohol. If you drink alcohol, limit your alcohol intake to no more than 1 drink a day for nonpregnant women and 2 drinks a day for men. One drink equals 12 oz of beer, 5 oz of wine, or 1 oz of hard liquor. Alcohol can affect any antidepressant medicines you are taking. Talk to your health care provider about your alcohol use.  Eat a healthy diet and get plenty of sleep.  Find activities that you enjoy doing, and make time to do them.  Consider joining a support group. Your health care provider may be able to recommend a support group.  Keep all follow-up visits as told by your health care provider. This is important. Where to find more information: National Alliance on Mental Illness  www.nami.org  [place]. National Institute of Mental Health  www.nimh.nih.gov  National Suicide Prevention  Lifeline  1-800-273-TALK (8255). This is free, 24-hour help.  Contact a health care provider if:  Your symptoms get worse.  You develop new symptoms. Get help right away if:  You self-harm.  You have serious thoughts about hurting yourself or others.  You see, hear, taste, smell, or feel things that are not present (hallucinate). This information is not intended to replace advice given to you by your health care provider. Make sure you discuss any questions you have with your health care provider. Document Released: 08/28/2012 Document Revised: 01/08/2016 Document Reviewed: 11/12/2015 Elsevier Interactive Patient Education  2017 [employer].  

## 2017-02-11 ENCOUNTER — Other Ambulatory Visit: Payer: Self-pay | Admitting: Family Medicine

## 2017-02-11 ENCOUNTER — Telehealth: Payer: Self-pay

## 2017-02-11 LAB — CMP14+EGFR
ALBUMIN: 3.9 g/dL (ref 3.5–5.5)
ALK PHOS: 67 IU/L (ref 39–117)
ALT: 22 IU/L (ref 0–44)
AST: 33 IU/L (ref 0–40)
Albumin/Globulin Ratio: 1.4 (ref 1.2–2.2)
BUN/Creatinine Ratio: 10 (ref 9–20)
BUN: 11 mg/dL (ref 6–24)
Bilirubin Total: 0.3 mg/dL (ref 0.0–1.2)
CALCIUM: 8.9 mg/dL (ref 8.7–10.2)
CO2: 24 mmol/L (ref 20–29)
CREATININE: 1.05 mg/dL (ref 0.76–1.27)
Chloride: 104 mmol/L (ref 96–106)
GFR calc Af Amer: 93 mL/min/{1.73_m2} (ref >59)
GFR, EST NON AFRICAN AMERICAN: 81 mL/min/{1.73_m2} (ref >59)
GLOBULIN, TOTAL: 2.8 g/dL (ref 1.5–4.5)
GLUCOSE: 96 mg/dL (ref 65–99)
Potassium: 4.2 mmol/L (ref 3.5–5.2)
Sodium: 143 mmol/L (ref 134–144)
Total Protein: 6.7 g/dL (ref 6.0–8.5)

## 2017-02-11 LAB — CBC WITH DIFFERENTIAL/PLATELET
BASOS: 1 %
Basophils Absolute: 0 10*3/uL (ref 0.0–0.2)
EOS (ABSOLUTE): 0.1 10*3/uL (ref 0.0–0.4)
EOS: 2 %
HEMATOCRIT: 38.9 % (ref 37.5–51.0)
Hemoglobin: 13.8 g/dL (ref 13.0–17.7)
Immature Grans (Abs): 0 10*3/uL (ref 0.0–0.1)
Immature Granulocytes: 0 %
LYMPHS ABS: 2.1 10*3/uL (ref 0.7–3.1)
Lymphs: 37 %
MCH: 35.9 pg — AB (ref 26.6–33.0)
MCHC: 35.5 g/dL (ref 31.5–35.7)
MCV: 101 fL — AB (ref 79–97)
MONOS ABS: 0.6 10*3/uL (ref 0.1–0.9)
Monocytes: 11 %
Neutrophils Absolute: 2.8 10*3/uL (ref 1.4–7.0)
Neutrophils: 49 %
Platelets: 242 10*3/uL (ref 150–379)
RBC: 3.84 x10E6/uL — ABNORMAL LOW (ref 4.14–5.80)
RDW: 16.2 % — ABNORMAL HIGH (ref 12.3–15.4)
WBC: 5.6 10*3/uL (ref 3.4–10.8)

## 2017-02-11 LAB — TSH: TSH: 2.73 u[IU]/mL (ref 0.450–4.500)

## 2017-02-11 LAB — VITAMIN D 25 HYDROXY (VIT D DEFICIENCY, FRACTURES): VIT D 25 HYDROXY: 25.5 ng/mL — AB (ref 30.0–100.0)

## 2017-02-11 MED ORDER — ERGOCALCIFEROL 1.25 MG (50000 UT) PO CAPS: 50000.0000 [IU] | ORAL_CAPSULE | ORAL | 1 refills | Status: DC

## 2017-02-11 NOTE — Telephone Encounter (Signed)
Pt was called and informed to contact office for lab results.

## 2017-02-14 ENCOUNTER — Telehealth: Payer: Self-pay | Admitting: Family Medicine

## 2017-02-14 MED FILL — VIT D2 1.25 MG (50,000 UNIT: 1.25 MG | 28 days supply | Qty: 4 | Fill #0

## 2017-02-14 NOTE — Telephone Encounter (Signed)
Patient came into office to request lab results, please f/up

## 2017-03-08 MED FILL — LISINOPRIL 20 MG TAB: 20 | 30 days supply | Qty: 30 | Fill #2

## 2017-03-08 MED FILL — ?CYCLOBENZAPRINE 10 MG TABL: 10 | 15 days supply | Qty: 30 | Fill #2

## 2017-03-08 MED FILL — ?CETIRIZINE HCL 10 MG TABLE: 10 | 30 days supply | Qty: 30 | Fill #1

## 2017-03-08 MED FILL — hydrOXYzine HCL 25 MG TABS: 25 | 10 days supply | Qty: 30 | Fill #2
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
## 2017-04-06 ENCOUNTER — Other Ambulatory Visit: Payer: Self-pay | Admitting: Family Medicine

## 2017-04-06 ENCOUNTER — Other Ambulatory Visit: Payer: Self-pay | Admitting: Internal Medicine

## 2017-04-06 DIAGNOSIS — I10 Essential (primary) hypertension: Secondary | ICD-10-CM

## 2017-04-06 DIAGNOSIS — F419 Anxiety disorder, unspecified: Principal | ICD-10-CM

## 2017-04-06 DIAGNOSIS — F329 Major depressive disorder, single episode, unspecified: Secondary | ICD-10-CM

## 2017-04-06 MED FILL — LISINOPRIL 20 MG TABLET: 20 | 30 days supply | Qty: 30 | Fill #3

## 2017-04-06 MED FILL — hydrOXYzine HCL 25 MG TABS: 25 | 10 days supply | Qty: 30 | Fill #3

## 2017-04-06 MED FILL — ?CETIRIZINE HCL 10 MG TABLE: 10 | 30 days supply | Qty: 30 | Fill #0

## 2017-05-06 MED FILL — LISINOPRIL 20 MG TAB: 20 | 30 days supply | Qty: 30 | Fill #3

## 2017-05-06 MED FILL — ?CETIRIZINE HCL 10 MG TABLE: 10 | 30 days supply | Qty: 30 | Fill #1

## 2017-05-13 ENCOUNTER — Ambulatory Visit: Payer: Self-pay | Attending: Family Medicine | Admitting: Family Medicine

## 2017-05-13 ENCOUNTER — Encounter: Payer: Self-pay | Admitting: Family Medicine

## 2017-05-13 VITALS — BP 136/79 | HR 84 | Temp 98.1°F | Resp 18 | Ht 74.0 in | Wt 196.0 lb

## 2017-05-13 DIAGNOSIS — F32A Depression, unspecified: Secondary | ICD-10-CM

## 2017-05-13 DIAGNOSIS — J328 Other chronic sinusitis: Secondary | ICD-10-CM

## 2017-05-13 DIAGNOSIS — F329 Major depressive disorder, single episode, unspecified: Secondary | ICD-10-CM | POA: Insufficient documentation

## 2017-05-13 DIAGNOSIS — M62830 Muscle spasm of back: Secondary | ICD-10-CM | POA: Insufficient documentation

## 2017-05-13 DIAGNOSIS — Z9114 Patient's other noncompliance with medication regimen: Secondary | ICD-10-CM | POA: Insufficient documentation

## 2017-05-13 DIAGNOSIS — F419 Anxiety disorder, unspecified: Secondary | ICD-10-CM | POA: Insufficient documentation

## 2017-05-13 DIAGNOSIS — I10 Essential (primary) hypertension: Secondary | ICD-10-CM | POA: Insufficient documentation

## 2017-05-13 DIAGNOSIS — E785 Hyperlipidemia, unspecified: Secondary | ICD-10-CM | POA: Insufficient documentation

## 2017-05-13 DIAGNOSIS — F1010 Alcohol abuse, uncomplicated: Secondary | ICD-10-CM | POA: Insufficient documentation

## 2017-05-13 DIAGNOSIS — J329 Chronic sinusitis, unspecified: Secondary | ICD-10-CM | POA: Insufficient documentation

## 2017-05-13 DIAGNOSIS — Z79899 Other long term (current) drug therapy: Secondary | ICD-10-CM | POA: Insufficient documentation

## 2017-05-13 DIAGNOSIS — E7800 Pure hypercholesterolemia, unspecified: Secondary | ICD-10-CM | POA: Insufficient documentation

## 2017-05-13 MED ORDER — LISINOPRIL 20 MG PO TABS: 20.0000 mg | ORAL_TABLET | Freq: Every day | ORAL | 3 refills | Status: DC

## 2017-05-13 MED ORDER — HYDROXYZINE HCL 25 MG PO TABS: ORAL_TABLET | ORAL | 3 refills | Status: DC

## 2017-05-13 MED ORDER — ATORVASTATIN CALCIUM 20 MG PO TABS: 20.0000 mg | ORAL_TABLET | Freq: Every day | ORAL | 3 refills | Status: DC

## 2017-05-13 MED ORDER — CYCLOBENZAPRINE HCL 10 MG PO TABS: 10.0000 mg | ORAL_TABLET | Freq: Two times a day (BID) | ORAL | 3 refills | Status: DC | PRN

## 2017-05-13 MED ORDER — FLUTICASONE PROPIONATE 50 MCG/ACT NA SUSP: 2.0000 | Freq: Every day | NASAL | 1 refills | Status: DC

## 2017-05-13 MED ORDER — CETIRIZINE HCL 10 MG PO TABS: 10.0000 mg | ORAL_TABLET | Freq: Every day | ORAL | 3 refills | Status: DC

## 2017-05-13 MED ORDER — BUPROPION HCL ER (SR) 150 MG PO TB12: 150.0000 mg | ORAL_TABLET | Freq: Two times a day (BID) | ORAL | 3 refills | Status: DC

## 2017-05-13 MED FILL — ?ATORVASTATIN 20 MG TABLET: 20 | 30 days supply | Qty: 30 | Fill #0

## 2017-05-13 MED FILL — BUPROPION SR 150 MG TABLET: 150 | 30 days supply | Qty: 60 | Fill #0

## 2017-05-13 MED FILL — FLUTICASONE PROP 50 MCG SPR: 50 | 30 days supply | Qty: 16 | Fill #0

## 2017-05-13 MED FILL — CYCLOBENZAPRINE 10 MG TAB: 10 | 15 days supply | Qty: 30 | Fill #0

## 2017-05-13 MED FILL — hydrOXYzine HCL 25 MG TABS: 25 | 10 days supply | Qty: 30 | Fill #0
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
## 2017-05-13 NOTE — Patient Instructions (Signed)
Alcohol Abuse and Nutrition Alcohol abuse is any pattern of alcohol consumption that harms your health, relationships, or work. Alcohol abuse can affect how your body breaks down and absorbs nutrients from food by causing your liver to work abnormally. Additionally, many people who abuse alcohol do not eat enough carbohydrates, protein, fat, vitamins, and minerals. This can cause poor nutrition (malnutrition) and a lack of nutrients (nutrient deficiencies), which can lead to further complications. Nutrients that are commonly lacking (deficient) among people who abuse alcohol include:  Vitamins. ? Vitamin A. This is stored in your liver. It is important for your vision, metabolism, and ability to fight off infections (immunity). ? B vitamins. These include vitamins such as folate, thiamin, and niacin. These are important in new cell growth and maintenance. ? Vitamin C. This plays an important role in iron absorption, wound healing, and immunity. ? Vitamin D. This is produced by your liver, but you can also get vitamin D from food. Vitamin D is necessary for your body to absorb and use calcium.  Minerals. ? Calcium. This is important for your bones and your heart and blood vessel (cardiovascular) function. ? Iron. This is important for blood, muscle, and nervous system functioning. ? Magnesium. This plays an important role in muscle and nerve function, and it helps to control blood sugar and blood pressure. ? Zinc. This is important for the normal function of your nervous system and digestive system (gastrointestinal tract).  Nutrition is an essential component of therapy for alcohol abuse. Your health care provider or dietitian will work with you to design a plan that can help restore nutrients to your body and prevent potential complications. What is my plan? Your dietitian may develop a specific diet plan that is based on your condition and any other complications you may have. A diet plan will  commonly include:  A balanced diet. ? Grains: 6-8 oz per day. ? Vegetables: 2-3 cups per day. ? Fruits: 1-2 cups per day. ? Meat and other protein: 5-6 oz per day. ? Dairy: 2-3 cups per day.  Vitamin and mineral supplements.  What do I need to know about alcohol and nutrition?  Consume foods that are high in antioxidants, such as grapes, berries, nuts, green tea, and dark green and orange vegetables. This can help to counteract some of the stress that is placed on your liver by consuming alcohol.  Avoid food and drinks that are high in fat and sugar. Foods such as sugared soft drinks, salty snack foods, and candy contain empty calories. This means that they lack important nutrients such as protein, fiber, and vitamins.  Eat frequent meals and snacks. Try to eat 5-6 small meals each day.  Eat a variety of fresh fruits and vegetables each day. This will help you get plenty of water, fiber, and vitamins in your diet.  Drink plenty of water and other clear fluids. Try to drink at least 48-64 oz (1.5-2 L) of water per day.  If you are a vegetarian, eat a variety of protein-rich foods. Pair whole grains with plant-based proteins at meals and snacks to obtain the greatest nutrient benefit from your food. For example, eat rice with beans, put peanut butter on whole-grain toast, or eat oatmeal with sunflower seeds.  Soak beans and whole grains overnight before cooking. This can help your body to absorb the nutrients more easily.  Include foods fortified with vitamins and minerals in your diet. Commonly fortified foods include milk, orange juice, cereal, and bread.  If you are malnourished, your dietitian may recommend a high-protein, high-calorie diet. This may include: ? 2,000-3,000 calories (kilocalories) per day. ? 70-100 grams of protein per day.  Your health care provider may recommend a complete nutritional supplement beverage. This can help to restore calories, protein, and vitamins to  your body. Depending on your condition, you may be advised to consume this instead of or in addition to meals.  Limit your intake of caffeine. Replace drinks like coffee and black tea with decaffeinated coffee and herbal tea.  Eat a variety of foods that are high in omega fatty acids. These include fish, nuts and seeds, and soybeans. These foods may help your liver to recover and may also stabilize your mood.  Certain medicines may cause changes in your appetite, taste, and weight. Work with your health care provider and dietitian to make any adjustments to your medicines and diet plan.  Include other healthy lifestyle choices in your daily routine. ? Be physically active. ? Get enough sleep. ? Spend time doing activities that you enjoy.  If you are unable to take in enough food and calories by mouth, your health care provider may recommend a feeding tube. This is a tube that passes through your nose and throat, directly into your stomach. Nutritional supplement beverages can be given to you through the feeding tube to help you get the nutrients you need.  Take vitamin or mineral supplements as recommended by your health care provider. What foods can I eat? Grains Enriched pasta. Enriched rice. Fortified whole-grain bread. Fortified whole-grain cereal. Barley. Brown rice. Quinoa. [place]. Vegetables All fresh, frozen, and canned vegetables. Spinach. Kale. Artichoke. Carrots. Winter squash and pumpkin. Sweet potatoes. Broccoli. Cabbage. Cucumbers. Tomatoes. Sweet peppers. Green beans. Peas. Corn. Fruits All fresh and frozen fruits. Berries. Grapes. Mango. Papaya. Guava. Cherries. Apples. Bananas. Peaches. Plums. Pineapple. Watermelon. Cantaloupe. Oranges. Avocado. Meats and Other Protein Sources Beef liver. Lean beef. Pork. Fresh and canned chicken. Fresh fish. Oysters. Sardines. Canned tuna. Shrimp. Eggs with yolks. Nuts and seeds. Peanut butter. Beans and lentils. Soybeans.  Tofu. Dairy Whole, low-fat, and nonfat milk. Whole, low-fat, and nonfat yogurt. Cottage cheese. Sour cream. Hard and soft cheeses. Beverages Water. Herbal tea. Decaffeinated coffee. Decaffeinated green tea. 100% fruit juice. 100% vegetable juice. Instant breakfast shakes. Condiments Ketchup. Mayonnaise. Mustard. Salad dressing. Barbecue sauce. Sweets and Desserts Sugar-free ice cream. Sugar-free pudding. Sugar-free gelatin. Fats and Oils Butter. Vegetable oil, flaxseed oil, olive oil, and walnut oil. Other Complete nutrition shakes. Protein bars. Sugar-free gum. The items listed above may not be a complete list of recommended foods or beverages. Contact your dietitian for more options. What foods are not recommended? Grains Sugar-sweetened breakfast cereals. Flavored instant oatmeal. Fried breads. Vegetables Breaded or deep-fried vegetables. Fruits Dried fruit with added sugar. Candied fruit. Canned fruit in syrup. Meats and Other Protein Sources Breaded or deep-fried meats. Dairy Flavored milks. Fried cheese curds or fried cheese sticks. Beverages Alcohol. Sugar-sweetened soft drinks. Sugar-sweetened tea. Caffeinated coffee and tea. Condiments Sugar. Honey. Agave nectar. Molasses. Sweets and Desserts Chocolate. Cake. Cookies. Candy. Other Potato chips. Pretzels. Salted nuts. Candied nuts. The items listed above may not be a complete list of foods and beverages to avoid. Contact your dietitian for more information. This information is not intended to replace advice given to you by your health care provider. Make sure you discuss any questions you have with your health care provider. Document Released: 02/25/2005 Document Revised: 09/10/2015 Document Reviewed: 12/04/2013 Elsevier Interactive Patient Education  2018 [employer]vier Inc[employer]  

## 2017-05-30 ENCOUNTER — Ambulatory Visit: Payer: Self-pay

## 2017-06-07 ENCOUNTER — Ambulatory Visit: Payer: Self-pay

## 2017-06-16 ENCOUNTER — Ambulatory Visit: Payer: Self-pay | Attending: Family Medicine

## 2017-06-16 MED FILL — BUPROPION SR 150 MG TABLET: 150 | 30 days supply | Qty: 60 | Fill #1

## 2017-06-16 MED FILL — ?CYCLOBENZAPRINE 10MG TB: 10 | 15 days supply | Qty: 30 | Fill #1

## 2017-06-16 MED FILL — ?CETIRIZINE HCL 10 MG TABLE: 10 | 30 days supply | Qty: 30 | Fill #0

## 2017-06-16 MED FILL — LISINOPRIL 20 MG TAB: 20 | 30 days supply | Qty: 30 | Fill #0

## 2017-07-11 MED FILL — LISINOPRIL 20 MG TAB: 20 | 30 days supply | Qty: 30 | Fill #1

## 2017-07-11 MED FILL — ?CETIRIZINE HCL 10 MG TABLE: 10 | 30 days supply | Qty: 30 | Fill #1

## 2017-08-05 MED FILL — LISINOPRIL 20 MG TAB: 20 | 30 days supply | Qty: 30 | Fill #2

## 2017-08-12 ENCOUNTER — Ambulatory Visit: Payer: Self-pay | Admitting: Family Medicine

## 2017-08-31 ENCOUNTER — Encounter: Payer: Self-pay | Admitting: Family Medicine

## 2017-08-31 ENCOUNTER — Ambulatory Visit: Payer: Self-pay | Attending: Family Medicine | Admitting: Family Medicine

## 2017-08-31 VITALS — BP 107/72 | HR 104 | Temp 97.5°F | Wt 200.2 lb

## 2017-08-31 DIAGNOSIS — Z7951 Long term (current) use of inhaled steroids: Secondary | ICD-10-CM | POA: Insufficient documentation

## 2017-08-31 DIAGNOSIS — F329 Major depressive disorder, single episode, unspecified: Secondary | ICD-10-CM | POA: Insufficient documentation

## 2017-08-31 DIAGNOSIS — E7800 Pure hypercholesterolemia, unspecified: Secondary | ICD-10-CM | POA: Insufficient documentation

## 2017-08-31 DIAGNOSIS — M62830 Muscle spasm of back: Secondary | ICD-10-CM | POA: Insufficient documentation

## 2017-08-31 DIAGNOSIS — J328 Other chronic sinusitis: Secondary | ICD-10-CM

## 2017-08-31 DIAGNOSIS — I10 Essential (primary) hypertension: Secondary | ICD-10-CM | POA: Insufficient documentation

## 2017-08-31 DIAGNOSIS — F17200 Nicotine dependence, unspecified, uncomplicated: Secondary | ICD-10-CM | POA: Insufficient documentation

## 2017-08-31 DIAGNOSIS — F419 Anxiety disorder, unspecified: Secondary | ICD-10-CM | POA: Insufficient documentation

## 2017-08-31 DIAGNOSIS — Z79899 Other long term (current) drug therapy: Secondary | ICD-10-CM | POA: Insufficient documentation

## 2017-08-31 DIAGNOSIS — J329 Chronic sinusitis, unspecified: Secondary | ICD-10-CM | POA: Insufficient documentation

## 2017-08-31 MED ORDER — FLUTICASONE PROPIONATE 50 MCG/ACT NA SUSP: 2.0000 | Freq: Every day | NASAL | 1 refills | Status: AC

## 2017-08-31 MED ORDER — NICOTINE 7 MG/24HR TD PT24: 7.0000 mg | MEDICATED_PATCH | Freq: Every day | TRANSDERMAL | 1 refills | Status: AC

## 2017-08-31 MED ORDER — LISINOPRIL 20 MG PO TABS: 20.0000 mg | ORAL_TABLET | Freq: Every day | ORAL | 6 refills | Status: DC

## 2017-08-31 MED ORDER — HYDROXYZINE HCL 25 MG PO TABS: ORAL_TABLET | ORAL | 6 refills | Status: AC

## 2017-08-31 MED ORDER — ATORVASTATIN CALCIUM 20 MG PO TABS: 20.0000 mg | ORAL_TABLET | Freq: Every day | ORAL | 6 refills | Status: DC

## 2017-08-31 MED ORDER — CETIRIZINE HCL 10 MG PO TABS: 10.0000 mg | ORAL_TABLET | Freq: Every day | ORAL | 6 refills | Status: DC

## 2017-08-31 MED ORDER — CYCLOBENZAPRINE HCL 10 MG PO TABS: 10.0000 mg | ORAL_TABLET | Freq: Two times a day (BID) | ORAL | 3 refills | Status: DC | PRN

## 2017-08-31 MED FILL — hydrOXYzine HCL 25 MG TABS: 25 | 10 days supply | Qty: 30 | Fill #0

## 2017-08-31 MED FILL — NICOTINE 7 MG/24HR PATCH: 7 | 28 days supply | Qty: 28 | Fill #0

## 2017-08-31 MED FILL — CYCLOBENZAPRINE 10 MG TAB: 10 | 15 days supply | Qty: 30 | Fill #0

## 2017-08-31 MED FILL — ATORVASTATIN 20 MG TABLET: 20 | 30 days supply | Qty: 30 | Fill #0

## 2017-08-31 MED FILL — LISINOPRIL 20 MG TAB: 20 | 30 days supply | Qty: 30 | Fill #0

## 2017-08-31 MED FILL — FLUTICASONE PROP 50 MCG SPR: 50 | 30 days supply | Qty: 16 | Fill #0

## 2017-08-31 NOTE — Patient Instructions (Signed)
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
## 2017-09-01 MED ORDER — AMOXICILLIN 500 MG PO CAPS: 500.0000 mg | ORAL_CAPSULE | Freq: Three times a day (TID) | ORAL | 0 refills | Status: DC

## 2017-09-01 MED FILL — AMOXICILLIN 500 MG CAPSULE: 500 | 10 days supply | Qty: 30 | Fill #0

## 2017-09-07 ENCOUNTER — Other Ambulatory Visit: Payer: Self-pay

## 2017-12-01 ENCOUNTER — Emergency Department (HOSPITAL_BASED_OUTPATIENT_CLINIC_OR_DEPARTMENT_OTHER): Payer: Self-pay

## 2017-12-01 ENCOUNTER — Encounter (HOSPITAL_BASED_OUTPATIENT_CLINIC_OR_DEPARTMENT_OTHER): Payer: Self-pay

## 2017-12-01 ENCOUNTER — Emergency Department (HOSPITAL_BASED_OUTPATIENT_CLINIC_OR_DEPARTMENT_OTHER)
Admission: EM | Admit: 2017-12-01 | Discharge: 2017-12-01 | Disposition: A | Discharge status: Home / Self Care | Payer: Self-pay | Attending: Emergency Medicine | Admitting: Emergency Medicine

## 2017-12-01 ENCOUNTER — Other Ambulatory Visit: Payer: Self-pay

## 2017-12-01 DIAGNOSIS — I10 Essential (primary) hypertension: Secondary | ICD-10-CM | POA: Insufficient documentation

## 2017-12-01 DIAGNOSIS — M5410 Radiculopathy, site unspecified: Secondary | ICD-10-CM | POA: Insufficient documentation

## 2017-12-01 DIAGNOSIS — F17210 Nicotine dependence, cigarettes, uncomplicated: Secondary | ICD-10-CM | POA: Insufficient documentation

## 2017-12-01 DIAGNOSIS — Z79899 Other long term (current) drug therapy: Secondary | ICD-10-CM | POA: Insufficient documentation

## 2017-12-01 LAB — URINALYSIS, ROUTINE W REFLEX MICROSCOPIC
Glucose, UA: NEGATIVE mg/dL
Hgb urine dipstick: NEGATIVE
Ketones, ur: 15 mg/dL — AB
Leukocytes, UA: NEGATIVE
Nitrite: NEGATIVE
Protein, ur: NEGATIVE mg/dL
Specific Gravity, Urine: 1.01 (ref 1.005–1.030)
pH: 7.5 (ref 5.0–8.0)

## 2017-12-01 MED ORDER — HYDROCODONE-ACETAMINOPHEN 5-325 MG PO TABS: 1.0000 | ORAL_TABLET | Freq: Four times a day (QID) | ORAL | 0 refills | Status: DC | PRN

## 2017-12-01 MED ORDER — NAPROXEN 500 MG PO TABS: 500.0000 mg | ORAL_TABLET | Freq: Two times a day (BID) | ORAL | 0 refills | Status: DC

## 2017-12-01 MED ORDER — KETOROLAC TROMETHAMINE 60 MG/2ML IM SOLN
60.0000 mg | Freq: Once | INTRAMUSCULAR | Status: AC
  Administered 2017-12-01: 60 mg via INTRAMUSCULAR
  Filled 2017-12-01: qty 2

## 2017-12-01 NOTE — ED Notes (Signed)
ED Provider at bedside. 

## 2017-12-01 NOTE — ED Notes (Signed)
Patient transported to CT 
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
## 2017-12-01 NOTE — ED Notes (Signed)
Dc instructions and prescriptions given to the pt. He verbalized understanding, unable to sign for dc paper, signature pad not working.

## 2017-12-01 NOTE — Discharge Instructions (Signed)
Naproxen as prescribed.  Hydrocodone as prescribed as needed for pain not relieved with naproxen.  Follow-up with your primary doctor as needed if symptoms are not improving in the next week.

## 2017-12-01 NOTE — ED Triage Notes (Signed)
C/o pain to right groin area radiates to right leg x 2 weeks-denies injury-NAD-steady gait

## 2018-10-23 ENCOUNTER — Other Ambulatory Visit: Payer: Self-pay

## 2018-10-23 ENCOUNTER — Ambulatory Visit: Payer: Self-pay | Attending: Family Medicine | Admitting: Family Medicine

## 2018-11-09 ENCOUNTER — Encounter: Payer: Self-pay | Admitting: Family Medicine

## 2018-11-09 ENCOUNTER — Ambulatory Visit: Payer: Self-pay | Attending: Family Medicine | Admitting: Family Medicine

## 2018-11-09 ENCOUNTER — Other Ambulatory Visit: Payer: Self-pay

## 2018-11-09 DIAGNOSIS — F319 Bipolar disorder, unspecified: Secondary | ICD-10-CM | POA: Insufficient documentation

## 2018-11-09 DIAGNOSIS — J328 Other chronic sinusitis: Secondary | ICD-10-CM

## 2018-11-09 DIAGNOSIS — M5431 Sciatica, right side: Secondary | ICD-10-CM

## 2018-11-09 DIAGNOSIS — I10 Essential (primary) hypertension: Secondary | ICD-10-CM

## 2018-11-09 MED ORDER — CYCLOBENZAPRINE HCL 10 MG PO TABS: 10.0000 mg | ORAL_TABLET | Freq: Two times a day (BID) | ORAL | 1 refills | Status: DC | PRN

## 2018-11-09 MED ORDER — LISINOPRIL 20 MG PO TABS: 20.0000 mg | ORAL_TABLET | Freq: Every day | ORAL | 6 refills | Status: DC

## 2018-11-09 MED ORDER — NAPROXEN 500 MG PO TABS: 500.0000 mg | ORAL_TABLET | Freq: Two times a day (BID) | ORAL | 1 refills | Status: DC

## 2018-11-09 MED ORDER — CETIRIZINE HCL 10 MG PO TABS: 10.0000 mg | ORAL_TABLET | Freq: Every day | ORAL | 3 refills | Status: AC

## 2018-11-09 MED FILL — NAPROXEN 500 MG TABLET: 500 | 30 days supply | Qty: 60 | Fill #0

## 2018-11-09 MED FILL — CYCLOBENZAPRINE 10 MG TAB: 10 | 15 days supply | Qty: 30 | Fill #0

## 2018-11-09 MED FILL — LISINOPRIL 20 MG TABLET: 20 | 30 days supply | Qty: 30 | Fill #0

## 2018-11-09 NOTE — Progress Notes (Signed)
Virtual Visit via Telephone Note  I connected with Ricardo Collier, on 11/09/2018 at 9:10 AM by telephone due to the COVID-19 pandemic and verified that I am speaking with the correct person using two identifiers.   Consent: I discussed the limitations, risks, security and privacy concerns of performing an evaluation and management service by telephone and the availability of in person appointments. I also discussed with the patient that there may be a patient responsible charge related to this service. The patient expressed understanding and agreed to proceed.   Location of Patient: Environmental education officer of Provider: Clinic   Persons participating in Telemedicine visit: [place] Slimp Farrington-CMA Dr. Felecia Shelling     History of Present Illness: Ricardo Collier is a 53 year old male with a history of hypertension , hyperlipidemia, depression and anxiety, seasonal allergies, alcohol abuse, tobacco abuse who presents today for a follow-up visit. His last office visit with me was 14 months ago and he informs me he was in alcohol rehab for 2 weeks and quit alcohol 1 month ago.  During his stay there he was commenced on medications for bipolar-Seroquel and Prozac.  Today he is concerned about right hip pain which radiates down his right leg and has been present for the last 3 months to the point where he cannot even walk.  He complains of associated tingling and denies recent falls, loss of sphincteric function. He has been out of his antihypertensive for the last couple of months and is requesting refills today. He denies chest pain, pedal edema.   Past Medical History:  Diagnosis Date  . Depression   . Hypertension    No Known Allergies  Current Outpatient Medications on File Prior to Visit  Medication Sig Dispense Refill  . cetirizine (ZYRTEC) 10 MG tablet Take 1 tablet (10 mg total) by mouth daily. 30 tablet 6  . cyclobenzaprine (FLEXERIL) 10 MG tablet Take 1 tablet (10 mg total)  by mouth 2 (two) times daily as needed for muscle spasms. 30 tablet 3  . ergocalciferol (DRISDOL) 50000 units capsule Take 1 capsule (50,000 Units total) by mouth once a week. 9 capsule 1  . fluticasone (FLONASE) 50 MCG/ACT nasal spray Place 2 sprays into both nostrils daily. 16 g 1  . hydrOXYzine (ATARAX/VISTARIL) 25 MG tablet TAKE 1 TABLET BY MOUTH 3 TIMES DAILY AS NEEDED FOR ANXIETY. 30 tablet 6  . lisinopril (PRINIVIL,ZESTRIL) 20 MG tablet Take 1 tablet (20 mg total) by mouth daily. 30 tablet 6  . naproxen (NAPROSYN) 500 MG tablet Take 1 tablet (500 mg total) by mouth 2 (two) times daily. 20 tablet 0  . nicotine (NICODERM CQ) 7 mg/24hr patch Place 1 patch (7 mg total) onto the skin daily. 28 patch 1  . HYDROcodone-acetaminophen (NORCO) 5-325 MG tablet Take 1-2 tablets by mouth every 6 (six) hours as needed. (Patient not taking: Reported on 11/09/2018) 15 tablet 0  . terbinafine (LAMISIL AT) 1 % cream Apply 1 application topically 2 (two) times daily. (Patient not taking: Reported on 08/31/2017) 30 g 1   No current facility-administered medications on file prior to visit.     Observations/Objective: Alert, awake, oriented x3 Not in acute distress  Assessment and Plan: 1. Sciatica of right side Discussed stretching exercises - cyclobenzaprine (FLEXERIL) 10 MG tablet; Take 1 tablet (10 mg total) by mouth 2 (two) times daily as needed for muscle spasms.  Dispense: 30 tablet; Refill: 1 - naproxen (NAPROSYN) 500 MG tablet; Take 1 tablet (500 mg total)  by mouth 2 (two) times daily.  Dispense: 60 tablet; Refill: 1  2. Essential hypertension, benign He has been out of his antihypertensives for a while which I have refilled Counseled on blood pressure goal of less than 130/80, low-sodium, DASH diet, medication compliance, 150 minutes of moderate intensity exercise per week. Discussed medication compliance, adverse effects. - lisinopril (ZESTRIL) 20 MG tablet; Take 1 tablet (20 mg total) by mouth  daily.  Dispense: 30 tablet; Refill: 6 - CMP14+EGFR; Future - Lipid panel; Future  3. Bipolar 1 disorder ([place]) He was placed on Seroquel and Prozac Discussed that he would need to be followed by psychiatry-educated about [facility] and walk-in services.  I will have the LCSW reach out to him to coordinate this - Ambulatory referral to Social Work  4. Other chronic sinusitis Uncontrolled as he has been out of his Zyrtec which I have refilled - cetirizine (ZYRTEC) 10 MG tablet; Take 1 tablet (10 mg total) by mouth daily.  Dispense: 30 tablet; Refill: 3   Follow Up Instructions: 3 months   I discussed the assessment and treatment plan with the patient. The patient was provided an opportunity to ask questions and all were answered. The patient agreed with the plan and demonstrated an understanding of the instructions.   The patient was advised to call back or seek an in-person evaluation if the symptoms worsen or if the condition fails to improve as anticipated.     I provided 16 minutes total of non-face-to-face time during this encounter including median intraservice time, reviewing previous notes, labs, imaging, medications, management and patient verbalized understanding.     Charlott Rakes, MD, FAAFP. [facility] [place], [place]   11/09/2018, 9:10 AM

## 2018-11-09 NOTE — Progress Notes (Signed)
Patient has been called and DOB has been verified. Patient has been screened and transferred to PCP to start phone visit.  Patient is having pain in his hips.

## 2018-11-16 ENCOUNTER — Ambulatory Visit: Payer: Self-pay | Attending: Family Medicine

## 2018-11-16 ENCOUNTER — Other Ambulatory Visit: Payer: Self-pay

## 2018-11-16 DIAGNOSIS — I10 Essential (primary) hypertension: Secondary | ICD-10-CM

## 2018-11-17 LAB — LIPID PANEL
Chol/HDL Ratio: 4.4 ratio (ref 0.0–5.0)
Cholesterol, Total: 198 mg/dL (ref 100–199)
HDL: 45 mg/dL (ref >39)
LDL Calculated: 88 mg/dL (ref 0–99)
Triglycerides: 323 mg/dL — ABNORMAL HIGH (ref 0–149)
VLDL Cholesterol Cal: 65 mg/dL — ABNORMAL HIGH (ref 5–40)

## 2018-11-17 LAB — CMP14+EGFR
ALT: 12 IU/L (ref 0–44)
AST: 16 IU/L (ref 0–40)
Albumin/Globulin Ratio: 1.6 (ref 1.2–2.2)
Albumin: 4.2 g/dL (ref 3.8–4.9)
Alkaline Phosphatase: 62 IU/L (ref 39–117)
BUN/Creatinine Ratio: 8 — ABNORMAL LOW (ref 9–20)
BUN: 10 mg/dL (ref 6–24)
Bilirubin Total: <0.2 mg/dL (ref 0.0–1.2)
CO2: 19 mmol/L — ABNORMAL LOW (ref 20–29)
Calcium: 9.3 mg/dL (ref 8.7–10.2)
Chloride: 104 mmol/L (ref 96–106)
Creatinine, Ser: 1.21 mg/dL (ref 0.76–1.27)
GFR calc Af Amer: 78 mL/min/{1.73_m2} (ref >59)
GFR calc non Af Amer: 67 mL/min/{1.73_m2} (ref >59)
Globulin, Total: 2.6 g/dL (ref 1.5–4.5)
Glucose: 92 mg/dL (ref 65–99)
Potassium: 4.7 mmol/L (ref 3.5–5.2)
Sodium: 139 mmol/L (ref 134–144)
Total Protein: 6.8 g/dL (ref 6.0–8.5)

## 2018-11-20 ENCOUNTER — Telehealth: Payer: Self-pay

## 2018-11-20 NOTE — Telephone Encounter (Signed)
Patient name and DOB has been verified Patient was informed of lab results. Patient had no questions.  

## 2018-11-20 NOTE — Telephone Encounter (Signed)
-----   Message from Charlott Rakes, MD sent at 11/20/2018  8:21 AM EDT ----- Kidney and liver functions are normal, total cholesterol is normal however triglycerides which is a type of cholesterol is elevated. Omega 3 fish oil caps will be beneficial.

## 2018-12-04 ENCOUNTER — Telehealth: Payer: Self-pay | Admitting: Licensed Clinical Social Worker

## 2018-12-04 MED FILL — LISINOPRIL 20 MG TABLET: 20 | 30 days supply | Qty: 30 | Fill #1

## 2018-12-04 MED FILL — CYCLOBENZAPRINE 10 MG TAB: 10 | 15 days supply | Qty: 30 | Fill #1

## 2018-12-04 MED FILL — NAPROXEN 500 MG TABLET: 500 | 30 days supply | Qty: 60 | Fill #1

## 2018-12-04 NOTE — Telephone Encounter (Signed)
Call placed to patient to follow up on consult from PCP to address behavioral health. Pt shared that he used to obtain medication management through [facility]; however, he has had difficulty re-initiating services.   LCSW provided patient with contact information for [facility] [employer] [facility] the [place]. Resources will also be mailed to address on file. Pt was appreciative for the information. No additional concerns noted.

## 2018-12-06 ENCOUNTER — Other Ambulatory Visit: Payer: Self-pay

## 2018-12-06 ENCOUNTER — Emergency Department (HOSPITAL_COMMUNITY)
Admission: EM | Admit: 2018-12-06 | Discharge: 2018-12-07 | Disposition: A | Discharge status: Home / Self Care | Payer: Medicaid Other | Attending: Emergency Medicine | Admitting: Emergency Medicine

## 2018-12-06 DIAGNOSIS — Z79899 Other long term (current) drug therapy: Secondary | ICD-10-CM | POA: Diagnosis not present

## 2018-12-06 DIAGNOSIS — F10920 Alcohol use, unspecified with intoxication, uncomplicated: Secondary | ICD-10-CM | POA: Diagnosis not present

## 2018-12-06 DIAGNOSIS — N289 Disorder of kidney and ureter, unspecified: Secondary | ICD-10-CM | POA: Insufficient documentation

## 2018-12-06 DIAGNOSIS — F1210 Cannabis abuse, uncomplicated: Secondary | ICD-10-CM | POA: Insufficient documentation

## 2018-12-06 DIAGNOSIS — F1410 Cocaine abuse, uncomplicated: Secondary | ICD-10-CM | POA: Insufficient documentation

## 2018-12-06 DIAGNOSIS — Y907 Blood alcohol level of 200-239 mg/100 ml: Secondary | ICD-10-CM | POA: Diagnosis not present

## 2018-12-06 DIAGNOSIS — T40601A Poisoning by unspecified narcotics, accidental (unintentional), initial encounter: Secondary | ICD-10-CM

## 2018-12-06 DIAGNOSIS — I10 Essential (primary) hypertension: Secondary | ICD-10-CM | POA: Diagnosis not present

## 2018-12-06 DIAGNOSIS — F17210 Nicotine dependence, cigarettes, uncomplicated: Secondary | ICD-10-CM | POA: Insufficient documentation

## 2018-12-06 LAB — CBC WITH DIFFERENTIAL/PLATELET
Abs Immature Granulocytes: 0.15 10*3/uL — ABNORMAL HIGH (ref 0.00–0.07)
Basophils Absolute: 0.1 10*3/uL (ref 0.0–0.1)
Basophils Relative: 1 %
Eosinophils Absolute: 0.1 10*3/uL (ref 0.0–0.5)
Eosinophils Relative: 1 %
HCT: 42.4 % (ref 39.0–52.0)
Hemoglobin: 14.4 g/dL (ref 13.0–17.0)
Immature Granulocytes: 1 %
Lymphocytes Relative: 7 %
Lymphs Abs: 0.8 10*3/uL (ref 0.7–4.0)
MCH: 34.8 pg — ABNORMAL HIGH (ref 26.0–34.0)
MCHC: 34 g/dL (ref 30.0–36.0)
MCV: 102.4 fL — ABNORMAL HIGH (ref 80.0–100.0)
Monocytes Absolute: 0.9 10*3/uL (ref 0.1–1.0)
Monocytes Relative: 8 %
Neutro Abs: 9.1 10*3/uL — ABNORMAL HIGH (ref 1.7–7.7)
Neutrophils Relative %: 82 %
Platelets: 305 10*3/uL (ref 150–400)
RBC: 4.14 MIL/uL — ABNORMAL LOW (ref 4.22–5.81)
RDW: 14.6 % (ref 11.5–15.5)
WBC: 11 10*3/uL — ABNORMAL HIGH (ref 4.0–10.5)
nRBC: 0 % (ref 0.0–0.2)
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
## 2018-12-06 NOTE — ED Notes (Signed)
EKG given to EDP,Glick,MD., for review. 

## 2018-12-07 ENCOUNTER — Emergency Department (HOSPITAL_COMMUNITY): Payer: Medicaid Other

## 2018-12-07 LAB — RAPID URINE DRUG SCREEN, HOSP PERFORMED
Amphetamines: NOT DETECTED
Barbiturates: NOT DETECTED
Benzodiazepines: NOT DETECTED
Cocaine: POSITIVE — AB
Opiates: NOT DETECTED
Tetrahydrocannabinol: POSITIVE — AB

## 2018-12-07 LAB — ETHANOL: Alcohol, Ethyl (B): 215 mg/dL — ABNORMAL HIGH (ref <10)

## 2018-12-07 LAB — COMPREHENSIVE METABOLIC PANEL
ALT: 16 U/L (ref 0–44)
AST: 22 U/L (ref 15–41)
Albumin: 4.1 g/dL (ref 3.5–5.0)
Alkaline Phosphatase: 56 U/L (ref 38–126)
Anion gap: 12 (ref 5–15)
BUN: 9 mg/dL (ref 6–20)
CO2: 20 mmol/L — ABNORMAL LOW (ref 22–32)
Calcium: 8.4 mg/dL — ABNORMAL LOW (ref 8.9–10.3)
Chloride: 104 mmol/L (ref 98–111)
Creatinine, Ser: 1.41 mg/dL — ABNORMAL HIGH (ref 0.61–1.24)
GFR calc Af Amer: >60 mL/min (ref >60)
GFR calc non Af Amer: 56 mL/min — ABNORMAL LOW (ref >60)
Glucose, Bld: 86 mg/dL (ref 70–99)
Potassium: 3.7 mmol/L (ref 3.5–5.1)
Sodium: 136 mmol/L (ref 135–145)
Total Bilirubin: 0.5 mg/dL (ref 0.3–1.2)
Total Protein: 7.2 g/dL (ref 6.5–8.1)

## 2018-12-07 LAB — MAGNESIUM: Magnesium: 2.1 mg/dL (ref 1.7–2.4)

## 2018-12-07 LAB — ACETAMINOPHEN LEVEL: Acetaminophen (Tylenol), Serum: <10 ug/mL — ABNORMAL LOW (ref 10–30)

## 2018-12-07 LAB — SALICYLATE LEVEL: Salicylate Lvl: <7 mg/dL (ref 2.8–30.0)

## 2018-12-07 MED ORDER — ACETAMINOPHEN 325 MG PO TABS
650.0000 mg | ORAL_TABLET | Freq: Once | ORAL | Status: AC
  Administered 2018-12-07: 650 mg via ORAL
  Filled 2018-12-07: qty 2

## 2018-12-07 NOTE — ED Notes (Signed)
Pt seen leaving the department before receiving discharge paperwork. Unable to find pt when this nurse went to look for him.

## 2018-12-07 NOTE — ED Notes (Signed)
Patient arrives by EMS-patient is agitated and aggressive with speech. Ripping off c-collar-states "I can't breathe" with it on. O2 sat 89%-O2 applied 2l/[place]-patient's shirt is wet, removed and placed on monitor-patient endorses "something he thought was cocaine" tonight and drinking alcohol. Strong smell of alcohol on patient-patient has abrasions to knuckles left hand, scratch to left upper chest and abrasion to bridge of nose

## 2018-12-07 NOTE — Discharge Instructions (Signed)
Please do not use cocaine.  The cocaine that you snorted tonight, was laced with fentanyl.  If EMS had not given you the antidote, it would have killed you.

## 2019-01-25 ENCOUNTER — Other Ambulatory Visit: Payer: Self-pay | Admitting: Family Medicine

## 2019-01-25 DIAGNOSIS — M5431 Sciatica, right side: Secondary | ICD-10-CM

## 2019-01-25 MED FILL — NAPROXEN 500 MG TABLET: 500 | 30 days supply | Qty: 60 | Fill #0

## 2019-01-25 MED FILL — CYCLOBENZAPRINE 10 MG TAB: 10 | 15 days supply | Qty: 30 | Fill #0

## 2019-01-25 MED FILL — LISINOPRIL 20 MG TABLET: 20 | 30 days supply | Qty: 30 | Fill #2

## 2019-02-14 ENCOUNTER — Ambulatory Visit: Payer: Self-pay | Admitting: Family Medicine

## 2019-02-26 ENCOUNTER — Other Ambulatory Visit: Payer: Self-pay | Admitting: Family Medicine

## 2019-02-26 DIAGNOSIS — M5431 Sciatica, right side: Secondary | ICD-10-CM

## 2019-02-26 MED FILL — LISINOPRIL 20 MG TABLET: 20 | 30 days supply | Qty: 30 | Fill #3

## 2019-02-27 ENCOUNTER — Other Ambulatory Visit: Payer: Self-pay | Admitting: Family Medicine

## 2019-02-27 DIAGNOSIS — M5431 Sciatica, right side: Secondary | ICD-10-CM

## 2019-04-10 MED FILL — LISINOPRIL 20 MG TABLET: 20 | 30 days supply | Qty: 30 | Fill #4

## 2019-05-04 MED FILL — LISINOPRIL 20 MG TABLET: 20 | 30 days supply | Qty: 30 | Fill #5

## 2019-05-29 MED FILL — LISINOPRIL 20 MG TABLET: 20 | 30 days supply | Qty: 30 | Fill #6

## 2019-06-01 ENCOUNTER — Other Ambulatory Visit: Payer: Self-pay

## 2019-06-01 ENCOUNTER — Emergency Department (HOSPITAL_BASED_OUTPATIENT_CLINIC_OR_DEPARTMENT_OTHER)
Admission: EM | Admit: 2019-06-01 | Discharge: 2019-06-01 | Disposition: A | Discharge status: Home / Self Care | Payer: Medicaid Other | Attending: Emergency Medicine | Admitting: Emergency Medicine

## 2019-06-01 ENCOUNTER — Encounter (HOSPITAL_BASED_OUTPATIENT_CLINIC_OR_DEPARTMENT_OTHER): Payer: Self-pay

## 2019-06-01 DIAGNOSIS — Z79899 Other long term (current) drug therapy: Secondary | ICD-10-CM | POA: Insufficient documentation

## 2019-06-01 DIAGNOSIS — I10 Essential (primary) hypertension: Secondary | ICD-10-CM | POA: Diagnosis not present

## 2019-06-01 DIAGNOSIS — E785 Hyperlipidemia, unspecified: Secondary | ICD-10-CM | POA: Diagnosis not present

## 2019-06-01 DIAGNOSIS — H1033 Unspecified acute conjunctivitis, bilateral: Secondary | ICD-10-CM | POA: Insufficient documentation

## 2019-06-01 DIAGNOSIS — F17210 Nicotine dependence, cigarettes, uncomplicated: Secondary | ICD-10-CM | POA: Diagnosis not present

## 2019-06-01 DIAGNOSIS — H109 Unspecified conjunctivitis: Secondary | ICD-10-CM

## 2019-06-01 DIAGNOSIS — H5713 Ocular pain, bilateral: Secondary | ICD-10-CM | POA: Diagnosis present

## 2019-06-01 MED ORDER — OXYCODONE-ACETAMINOPHEN 5-325 MG PO TABS
1.0000 | ORAL_TABLET | Freq: Once | ORAL | Status: AC
  Administered 2019-06-01: 1 via ORAL
  Filled 2019-06-01: qty 1

## 2019-06-01 MED ORDER — PROPARACAINE HCL 0.5 % OP SOLN
1.0000 [drp] | Freq: Once | OPHTHALMIC | Status: AC
  Administered 2019-06-01: 1 [drp] via OPHTHALMIC
  Filled 2019-06-01: qty 15

## 2019-06-01 MED ORDER — LISINOPRIL 20 MG PO TABS: 20.0000 mg | ORAL_TABLET | Freq: Every day | ORAL | 2 refills | Status: AC

## 2019-06-01 MED ORDER — POLYMYXIN B-TRIMETHOPRIM 10000-0.1 UNIT/ML-% OP SOLN: 1.0000 [drp] | OPHTHALMIC | 0 refills | Status: AC

## 2019-06-01 MED ORDER — OXYCODONE-ACETAMINOPHEN 5-325 MG PO TABS: 1.0000 | ORAL_TABLET | Freq: Four times a day (QID) | ORAL | 0 refills | Status: AC | PRN

## 2019-06-01 MED ORDER — FLUORESCEIN SODIUM 1 MG OP STRP
2.0000 | ORAL_STRIP | Freq: Once | OPHTHALMIC | Status: AC
  Administered 2019-06-01: 2 via OPHTHALMIC
  Filled 2019-06-01: qty 2

## 2019-06-01 MED ORDER — IBUPROFEN 400 MG PO TABS
600.0000 mg | ORAL_TABLET | Freq: Once | ORAL | Status: AC
  Administered 2019-06-01: 600 mg via ORAL
  Filled 2019-06-01: qty 1

## 2019-06-01 MED FILL — LISINOPRIL 20 MG TABLET: 20 | 30 days supply | Qty: 30 | Fill #0

## 2019-06-01 MED FILL — POLYMYXIN B/TMP EYE DROPS: 10000-0.1 | 17 days supply | Qty: 10 | Fill #0

## 2019-06-01 MED FILL — OXYCODONE-ACETAMINOPHEN 5-3: 5-325 | 2 days supply | Qty: 10 | Fill #0
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
## 2019-06-01 NOTE — ED Notes (Signed)
ED Provider at bedside. 

## 2019-06-01 NOTE — ED Triage Notes (Addendum)
Pt c/o redness to both eyes x 3 days-denies fever/flu sx-states he used OTC eye drops w/o relief-NAD-steady gait

## 2019-06-01 NOTE — Discharge Instructions (Signed)
Use eye drops as prescribed. Take ibuprofen 600 mg every 6 hours as needed for pain. Use percocet for breakthrough pain. Follow-up with ophthalmology.

## 2019-06-29 MED FILL — LISINOPRIL 20 MG TABLET: 20 | 30 days supply | Qty: 30 | Fill #6

## 2019-08-16 DEATH — deceased
